# Patient Record
Sex: Male | Born: 1990 | Race: Black or African American | Hispanic: No | Marital: Single | Smoking: Current every day smoker
Health system: Southern US, Community
[De-identification: ages and names within clinical notes are randomized; demographics above are authoritative.]

## PROBLEM LIST (undated history)

## (undated) DIAGNOSIS — K219 Gastro-esophageal reflux disease without esophagitis: Secondary | ICD-10-CM

## (undated) DIAGNOSIS — F1910 Other psychoactive substance abuse, uncomplicated: Secondary | ICD-10-CM

## (undated) DIAGNOSIS — M51369 Other intervertebral disc degeneration, lumbar region without mention of lumbar back pain or lower extremity pain: Secondary | ICD-10-CM

---
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
## 2013-06-06 ENCOUNTER — Observation Stay (HOSPITAL_COMMUNITY): Payer: BC Managed Care – PPO | Admitting: Anesthesiology

## 2013-06-06 ENCOUNTER — Encounter (HOSPITAL_COMMUNITY)
Admission: EM | Disposition: A | Discharge status: Home / Self Care | Payer: Self-pay | Source: Home / Self Care | Attending: Emergency Medicine

## 2013-06-06 ENCOUNTER — Inpatient Hospital Stay: Admit: 2013-06-06 | Payer: Self-pay | Admitting: Ophthalmology

## 2013-06-06 ENCOUNTER — Observation Stay (HOSPITAL_COMMUNITY)
Admission: EM | Admit: 2013-06-06 | Discharge: 2013-06-07 | Disposition: A | Discharge status: Home / Self Care | Payer: BC Managed Care – PPO | Attending: General Surgery | Admitting: General Surgery

## 2013-06-06 ENCOUNTER — Encounter (HOSPITAL_COMMUNITY): Payer: Self-pay | Admitting: *Deleted

## 2013-06-06 ENCOUNTER — Emergency Department (HOSPITAL_COMMUNITY): Payer: BC Managed Care – PPO

## 2013-06-06 ENCOUNTER — Encounter (HOSPITAL_COMMUNITY): Payer: Self-pay | Admitting: Anesthesiology

## 2013-06-06 DIAGNOSIS — S0120XA Unspecified open wound of nose, initial encounter: Secondary | ICD-10-CM | POA: Insufficient documentation

## 2013-06-06 DIAGNOSIS — S01119A Laceration without foreign body of unspecified eyelid and periocular area, initial encounter: Secondary | ICD-10-CM | POA: Insufficient documentation

## 2013-06-06 DIAGNOSIS — S0180XA Unspecified open wound of other part of head, initial encounter: Secondary | ICD-10-CM | POA: Insufficient documentation

## 2013-06-06 DIAGNOSIS — Y9367 Activity, basketball: Secondary | ICD-10-CM | POA: Insufficient documentation

## 2013-06-06 DIAGNOSIS — S0531XA Ocular laceration without prolapse or loss of intraocular tissue, right eye, initial encounter: Secondary | ICD-10-CM

## 2013-06-06 DIAGNOSIS — S0181XA Laceration without foreign body of other part of head, initial encounter: Secondary | ICD-10-CM

## 2013-06-06 DIAGNOSIS — S51812A Laceration without foreign body of left forearm, initial encounter: Secondary | ICD-10-CM

## 2013-06-06 DIAGNOSIS — S51809A Unspecified open wound of unspecified forearm, initial encounter: Secondary | ICD-10-CM | POA: Insufficient documentation

## 2013-06-06 DIAGNOSIS — S0530XA Ocular laceration without prolapse or loss of intraocular tissue, unspecified eye, initial encounter: Principal | ICD-10-CM | POA: Insufficient documentation

## 2013-06-06 DIAGNOSIS — S0280XB Fracture of other specified skull and facial bones, unspecified side, initial encounter for open fracture: Secondary | ICD-10-CM | POA: Insufficient documentation

## 2013-06-06 DIAGNOSIS — Y9239 Other specified sports and athletic area as the place of occurrence of the external cause: Secondary | ICD-10-CM | POA: Insufficient documentation

## 2013-06-06 DIAGNOSIS — S058X9A Other injuries of unspecified eye and orbit, initial encounter: Secondary | ICD-10-CM

## 2013-06-06 HISTORY — PX: EYELID LACERATION REPAIR: SHX5333

## 2013-06-06 HISTORY — PX: PRIMARY CLOSURE: SHX6224

## 2013-06-06 HISTORY — PX: RUPTURED GLOBE EXPLORATION AND REPAIR: SHX2366

## 2013-06-06 LAB — TYPE AND SCREEN
ABO/RH(D): O POS
Antibody Screen: NEGATIVE
Unit division: 0
Unit division: 0

## 2013-06-06 LAB — CBC WITH DIFFERENTIAL/PLATELET
HCT: 41 % (ref 39.0–52.0)
Hemoglobin: 14 g/dL (ref 13.0–17.0)
Lymphocytes Relative: 51 % — ABNORMAL HIGH (ref 12–46)
Lymphs Abs: 3.3 10*3/uL (ref 0.7–4.0)
Monocytes Absolute: 0.5 10*3/uL (ref 0.1–1.0)
Monocytes Relative: 9 % (ref 3–12)
Neutro Abs: 2.4 10*3/uL (ref 1.7–7.7)
Neutrophils Relative %: 37 % — ABNORMAL LOW (ref 43–77)
RBC: 4.69 MIL/uL (ref 4.22–5.81)

## 2013-06-06 LAB — PROTIME-INR
INR: 0.95 (ref 0.00–1.49)
Prothrombin Time: 12.5 seconds (ref 11.6–15.2)

## 2013-06-06 LAB — COMPREHENSIVE METABOLIC PANEL
Albumin: 3.7 g/dL (ref 3.5–5.2)
Alkaline Phosphatase: 59 U/L (ref 39–117)
BUN: 22 mg/dL (ref 6–23)
CO2: 19 mEq/L (ref 19–32)
Chloride: 105 mEq/L (ref 96–112)
Creatinine, Ser: 1.56 mg/dL — ABNORMAL HIGH (ref 0.50–1.35)
GFR calc Af Amer: 73 mL/min — ABNORMAL LOW (ref >90)
GFR calc non Af Amer: 63 mL/min — ABNORMAL LOW (ref >90)
Glucose, Bld: 106 mg/dL — ABNORMAL HIGH (ref 70–99)
Potassium: 3.6 mEq/L (ref 3.5–5.1)
Total Bilirubin: 0.1 mg/dL — ABNORMAL LOW (ref 0.3–1.2)

## 2013-06-06 LAB — POCT I-STAT, CHEM 8
BUN: 25 mg/dL — ABNORMAL HIGH (ref 6–23)
Creatinine, Ser: 1.8 mg/dL — ABNORMAL HIGH (ref 0.50–1.35)
Glucose, Bld: 102 mg/dL — ABNORMAL HIGH (ref 70–99)
Sodium: 143 mEq/L (ref 135–145)
TCO2: 19 mmol/L (ref 0–100)

## 2013-06-06 SURGERY — REPAIR, LACERATION, EYELID
Anesthesia: General | Laterality: Right

## 2013-06-06 SURGERY — REPAIR, RUPTURE, GLOBE
Anesthesia: General | Site: Arm Lower | Laterality: Right

## 2013-06-06 MED ORDER — LIDOCAINE HCL (CARDIAC) 20 MG/ML IV SOLN
INTRAVENOUS | Status: DC | PRN
  Administered 2013-06-06: 100 mg via INTRAVENOUS

## 2013-06-06 MED ORDER — TETANUS-DIPHTH-ACELL PERTUSSIS 5-2.5-18.5 LF-MCG/0.5 IM SUSP: 0.5000 mL | Freq: Once | INTRAMUSCULAR | Status: AC

## 2013-06-06 MED ORDER — PROPOFOL 10 MG/ML IV BOLUS
INTRAVENOUS | Status: DC | PRN
  Administered 2013-06-06: 120 mg via INTRAVENOUS

## 2013-06-06 MED ORDER — IOHEXOL 300 MG/ML  SOLN
80.0000 mL | Freq: Once | INTRAMUSCULAR | Status: AC | PRN
  Administered 2013-06-06: 80 mL via INTRAVENOUS

## 2013-06-06 MED ORDER — BSS IO SOLN
INTRAOCULAR | Status: DC | PRN
  Administered 2013-06-06: 15 mL via INTRAOCULAR

## 2013-06-06 MED ORDER — FENTANYL CITRATE 0.05 MG/ML IJ SOLN
100.0000 ug | Freq: Once | INTRAMUSCULAR | Status: AC
  Administered 2013-06-06: 100 ug via INTRAVENOUS

## 2013-06-06 MED ORDER — CEFAZOLIN SODIUM-DEXTROSE 2-3 GM-% IV SOLR
INTRAVENOUS | Status: DC | PRN
  Administered 2013-06-06: 2 g via INTRAVENOUS

## 2013-06-06 MED ORDER — FENTANYL CITRATE 0.05 MG/ML IJ SOLN
INTRAMUSCULAR | Status: DC | PRN
  Administered 2013-06-06: 25 ug via INTRAVENOUS
  Administered 2013-06-06 (×2): 50 ug via INTRAVENOUS
  Administered 2013-06-06: 125 ug via INTRAVENOUS

## 2013-06-06 MED ORDER — FENTANYL CITRATE 0.05 MG/ML IJ SOLN
INTRAMUSCULAR | Status: AC
  Filled 2013-06-06: qty 2

## 2013-06-06 MED ORDER — FENTANYL CITRATE 0.05 MG/ML IJ SOLN
INTRAMUSCULAR | Status: AC | PRN
  Administered 2013-06-06: 100 ug via INTRAVENOUS

## 2013-06-06 MED ORDER — TETANUS-DIPHTH-ACELL PERTUSSIS 5-2.5-18.5 LF-MCG/0.5 IM SUSP
INTRAMUSCULAR | Status: AC
  Administered 2013-06-06: 0.5 mL via INTRAMUSCULAR
  Filled 2013-06-06: qty 0.5

## 2013-06-06 MED ORDER — 0.9 % SODIUM CHLORIDE (POUR BTL) OPTIME
TOPICAL | Status: DC | PRN
  Administered 2013-06-06: 1000 mL

## 2013-06-06 MED ORDER — LACTATED RINGERS IV SOLN
INTRAVENOUS | Status: DC | PRN
  Administered 2013-06-06 (×2): via INTRAVENOUS

## 2013-06-06 MED ORDER — LIDOCAINE HCL 4 % MT SOLN
OROMUCOSAL | Status: DC | PRN
  Administered 2013-06-06: 4 mL via TOPICAL

## 2013-06-06 MED ORDER — MIDAZOLAM HCL 5 MG/5ML IJ SOLN
INTRAMUSCULAR | Status: DC | PRN
  Administered 2013-06-06: 2 mg via INTRAVENOUS

## 2013-06-06 MED ORDER — ROCURONIUM BROMIDE 100 MG/10ML IV SOLN
INTRAVENOUS | Status: DC | PRN
  Administered 2013-06-06: 50 mg via INTRAVENOUS

## 2013-06-06 SURGICAL SUPPLY — 75 items
ACCESSORY FRAGMATOME (MISCELLANEOUS) IMPLANT
APL SRG 3 HI ABS STRL LF PLS (MISCELLANEOUS) ×2
APPLICATOR DR MATTHEWS STRL (MISCELLANEOUS) ×3 IMPLANT
BAG URINE DRAINAGE (UROLOGICAL SUPPLIES) IMPLANT
BLADE EYE CATARACT 19 1.4 BEAV (BLADE) IMPLANT
BLADE MVR KNIFE 19G (BLADE) IMPLANT
BLADE SURG 15 STRL LF DISP TIS (BLADE) IMPLANT
BLADE SURG 15 STRL SS (BLADE)
CANNULA ANT CHAM MAIN (OPHTHALMIC RELATED) IMPLANT
CANNULA SUBRETINAL FLUID 20G (BLADE) IMPLANT
CATH FOLEY 2WAY SLVR  5CC 16FR (CATHETERS)
CATH FOLEY 2WAY SLVR 5CC 16FR (CATHETERS) IMPLANT
CLOTH BEACON ORANGE TIMEOUT ST (SAFETY) ×3 IMPLANT
CORDS BIPOLAR (ELECTRODE) IMPLANT
COVER MAYO STAND STRL (DRAPES) IMPLANT
COVER SURGICAL LIGHT HANDLE (MISCELLANEOUS) IMPLANT
DRAPE OPHTHALMIC 77X100 STRL (CUSTOM PROCEDURE TRAY) ×3 IMPLANT
EAGLE VIT/RET MICRO PIC 168 25 (MISCELLANEOUS) IMPLANT
ERASER HMR WETFIELD 23G BP (MISCELLANEOUS) IMPLANT
FILTER BLUE MILLIPORE (MISCELLANEOUS) IMPLANT
GAS OPHTHALMIC (MISCELLANEOUS) IMPLANT
GOWN STRL NON-REIN LRG LVL3 (GOWN DISPOSABLE) ×6 IMPLANT
ILLUMINATOR CHOW PICK 25GA (MISCELLANEOUS) IMPLANT
KIT BASIN OR (CUSTOM PROCEDURE TRAY) ×3 IMPLANT
KIT PERFLUORON PROCEDURE 5ML (MISCELLANEOUS) IMPLANT
KIT ROOM TURNOVER OR (KITS) ×3 IMPLANT
KNIFE CRESCENT 1.75 EDGEAHEAD (BLADE) IMPLANT
KNIFE GRIESHABER SHARP 2.5MM (MISCELLANEOUS) IMPLANT
MARKER SKIN DUAL TIP RULER LAB (MISCELLANEOUS) IMPLANT
NDL 18GX1X1/2 (RX/OR ONLY) (NEEDLE) ×2 IMPLANT
NDL 25GX 5/8IN NON SAFETY (NEEDLE) ×2 IMPLANT
NDL HYPO 30X.5 LL (NEEDLE) IMPLANT
NEEDLE 18GX1X1/2 (RX/OR ONLY) (NEEDLE) ×3 IMPLANT
NEEDLE 25GX 5/8IN NON SAFETY (NEEDLE) ×3 IMPLANT
NEEDLE 27GAX1X1/2 (NEEDLE) IMPLANT
NEEDLE HYPO 30X.5 LL (NEEDLE) IMPLANT
NS IRRIG 1000ML POUR BTL (IV SOLUTION) ×3 IMPLANT
PACK VITRECTOMY CUSTOM (CUSTOM PROCEDURE TRAY) ×3 IMPLANT
PACK VITRECTOMY PIC MCHSVP (PACKS) IMPLANT
PAD ARMBOARD 7.5X6 YLW CONV (MISCELLANEOUS) ×6 IMPLANT
PROBE LASER 20G CVD ENDOCULAR (MISCELLANEOUS) IMPLANT
PROBE LASER LIGHT 20GA (MISCELLANEOUS) IMPLANT
REPL STRA BRUSH NDL (NEEDLE) IMPLANT
REPL STRA BRUSH NEEDLE (NEEDLE) IMPLANT
RESERVOIR BACK FLUSH (MISCELLANEOUS) IMPLANT
ROLLS DENTAL (MISCELLANEOUS) ×6 IMPLANT
SCRAPER DIAMOND DUST MEMBRANE (MISCELLANEOUS) IMPLANT
SET VGFI TUBING 8065808002 (SET/KITS/TRAYS/PACK) IMPLANT
SPEAR EYE SURG WECK-CEL (MISCELLANEOUS) ×3 IMPLANT
STOPCOCK 4 WAY LG BORE MALE ST (IV SETS) IMPLANT
SUT CHROMIC 5 0 P 3 (SUTURE) ×1 IMPLANT
SUT CHROMIC 7 0 TG140 8 (SUTURE) IMPLANT
SUT ETHILON 10 0 BV100 4 (SUTURE) IMPLANT
SUT ETHILON 10 0 BV75 4 (SUTURE) IMPLANT
SUT ETHILON 10 0 CS140 6 (SUTURE) IMPLANT
SUT ETHILON 7 0 P 1 (SUTURE) ×2 IMPLANT
SUT ETHILON 9 0 TG140 8 (SUTURE) IMPLANT
SUT MON AB 5-0 PS2 18 (SUTURE) ×1 IMPLANT
SUT SILK 2 0 (SUTURE)
SUT SILK 2-0 18XBRD TIE 12 (SUTURE) IMPLANT
SUT SILK 6 0 G 6 (SUTURE) ×1 IMPLANT
SUT VIC AB 5-0 P-3 18XBRD (SUTURE) IMPLANT
SUT VIC AB 5-0 P3 18 (SUTURE) ×3
SUT VICRYL 7 0 TG140 8 (SUTURE) IMPLANT
SWAB COLLECTION DEVICE MRSA (MISCELLANEOUS) IMPLANT
SYR 20CC LL (SYRINGE) ×3 IMPLANT
SYR 50ML SLIP (SYRINGE) IMPLANT
SYR 5ML LL (SYRINGE) IMPLANT
SYR TB 1ML LUER SLIP (SYRINGE) ×3 IMPLANT
SYRINGE 10CC LL (SYRINGE) IMPLANT
TOWEL OR 17X24 6PK STRL BLUE (TOWEL DISPOSABLE) ×9 IMPLANT
TUBE ANAEROBIC SPECIMEN COL (MISCELLANEOUS) IMPLANT
VITREORETINAL VISCODISSEC (MISCELLANEOUS) IMPLANT
WATER STERILE IRR 1000ML POUR (IV SOLUTION) ×3 IMPLANT
WIPE INSTRUMENT VISIWIPE 73X73 (MISCELLANEOUS) ×3 IMPLANT
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
## 2013-06-06 NOTE — Anesthesia Preprocedure Evaluation (Signed)
Anesthesia Evaluation  Patient identified by MRN, date of birth, ID band Patient awake    Reviewed: Allergy & Precautions, H&P , NPO status , Patient's Chart, lab work & pertinent test results  Airway Mallampati: I TM Distance: >3 FB Neck ROM: Full    Dental   Pulmonary          Cardiovascular     Neuro/Psych    GI/Hepatic   Endo/Other    Renal/GU      Musculoskeletal   Abdominal   Peds  Hematology   Anesthesia Other Findings   Reproductive/Obstetrics                           Anesthesia Physical Anesthesia Plan  ASA: II and emergent  Anesthesia Plan: General   Post-op Pain Management:    Induction: Intravenous, Rapid sequence and Cricoid pressure planned  Airway Management Planned: Oral ETT  Additional Equipment:   Intra-op Plan:   Post-operative Plan: Extubation in OR  Informed Consent: I have reviewed the patients History and Physical, chart, labs and discussed the procedure including the risks, benefits and alternatives for the proposed anesthesia with the patient or authorized representative who has indicated his/her understanding and acceptance.     Plan Discussed with: CRNA and Surgeon  Anesthesia Plan Comments:         Anesthesia Quick Evaluation  
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
## 2013-06-06 NOTE — ED Notes (Signed)
Pt transported to CT ?

## 2013-06-06 NOTE — ED Notes (Signed)
Patient last ate at 1600 today.
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
## 2013-06-06 NOTE — ED Notes (Signed)
Pt returned from CT/XRay. 
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
## 2013-06-06 NOTE — ED Notes (Signed)
Dr. Allyne Gee at bedside. Family updated on pt's condition. Informed consent signed

## 2013-06-06 NOTE — ED Notes (Signed)
Pt transported to Xray. 

## 2013-06-07 ENCOUNTER — Encounter (HOSPITAL_COMMUNITY): Payer: Self-pay | Admitting: Plastic Surgery

## 2013-06-07 DIAGNOSIS — S0181XA Laceration without foreign body of other part of head, initial encounter: Secondary | ICD-10-CM

## 2013-06-07 MED ORDER — BACITRACIN-POLYMYXIN B 500-10000 UNIT/GM OP OINT
TOPICAL_OINTMENT | OPHTHALMIC | Status: AC
  Filled 2013-06-07: qty 3.5

## 2013-06-07 MED ORDER — MORPHINE SULFATE 2 MG/ML IJ SOLN
2.0000 mg | INTRAMUSCULAR | Status: DC | PRN
  Administered 2013-06-07: 2 mg via INTRAVENOUS
  Administered 2013-06-07 (×2): 4 mg via INTRAVENOUS
  Filled 2013-06-07: qty 2
  Filled 2013-06-07: qty 1
  Filled 2013-06-07: qty 2

## 2013-06-07 MED ORDER — ONDANSETRON HCL 4 MG/2ML IJ SOLN: 4.0000 mg | Freq: Four times a day (QID) | INTRAMUSCULAR | Status: DC | PRN

## 2013-06-07 MED ORDER — HYDROMORPHONE HCL PF 1 MG/ML IJ SOLN
0.2500 mg | INTRAMUSCULAR | Status: DC | PRN
  Administered 2013-06-07 (×2): 0.5 mg via INTRAVENOUS

## 2013-06-07 MED ORDER — PANTOPRAZOLE SODIUM 40 MG PO TBEC: 40.0000 mg | DELAYED_RELEASE_TABLET | Freq: Every day | ORAL | Status: DC

## 2013-06-07 MED ORDER — PANTOPRAZOLE SODIUM 40 MG IV SOLR
40.0000 mg | Freq: Every day | INTRAVENOUS | Status: DC
  Administered 2013-06-07: 40 mg via INTRAVENOUS
  Filled 2013-06-07: qty 40

## 2013-06-07 MED ORDER — HYDROMORPHONE HCL PF 1 MG/ML IJ SOLN
INTRAMUSCULAR | Status: AC
  Filled 2013-06-07: qty 1

## 2013-06-07 MED ORDER — OXYCODONE HCL 5 MG/5ML PO SOLN: 5.0000 mg | Freq: Once | ORAL | Status: DC | PRN

## 2013-06-07 MED ORDER — ONDANSETRON HCL 4 MG/2ML IJ SOLN
INTRAMUSCULAR | Status: DC | PRN
  Administered 2013-06-07: 4 mg via INTRAVENOUS

## 2013-06-07 MED ORDER — ERYTHROMYCIN 5 MG/GM OP OINT
TOPICAL_OINTMENT | Freq: Four times a day (QID) | OPHTHALMIC | Status: DC
  Administered 2013-06-07 (×2): via OPHTHALMIC
  Filled 2013-06-07: qty 3.5

## 2013-06-07 MED ORDER — MEPERIDINE HCL 25 MG/ML IJ SOLN: 6.2500 mg | INTRAMUSCULAR | Status: DC | PRN

## 2013-06-07 MED ORDER — ONDANSETRON HCL 4 MG PO TABS: 4.0000 mg | ORAL_TABLET | Freq: Four times a day (QID) | ORAL | Status: DC | PRN

## 2013-06-07 MED ORDER — OXYCODONE HCL 5 MG PO TABS: 5.0000 mg | ORAL_TABLET | Freq: Once | ORAL | Status: DC | PRN

## 2013-06-07 MED ORDER — OXYCODONE HCL 5 MG PO TABS: 5.0000 mg | ORAL_TABLET | ORAL | Status: DC | PRN

## 2013-06-07 MED ORDER — ACETAMINOPHEN 325 MG PO TABS: 650.0000 mg | ORAL_TABLET | ORAL | Status: DC | PRN

## 2013-06-07 MED ORDER — BACITRACIN-POLYMYXIN B 500-10000 UNIT/GM OP OINT
TOPICAL_OINTMENT | OPHTHALMIC | Status: DC | PRN
  Administered 2013-06-07: 1 via OPHTHALMIC

## 2013-06-07 MED ORDER — ONDANSETRON HCL 4 MG/2ML IJ SOLN: 4.0000 mg | Freq: Once | INTRAMUSCULAR | Status: DC | PRN

## 2013-06-07 MED ORDER — OXYCODONE HCL 5 MG PO TABS: 10.0000 mg | ORAL_TABLET | ORAL | Status: DC | PRN

## 2013-06-07 MED ORDER — KCL IN DEXTROSE-NACL 20-5-0.45 MEQ/L-%-% IV SOLN
INTRAVENOUS | Status: DC
  Administered 2013-06-07: 02:00:00 via INTRAVENOUS
  Filled 2013-06-07 (×2): qty 1000

## 2013-06-07 MED ORDER — DEXAMETHASONE SODIUM PHOSPHATE 4 MG/ML IJ SOLN
INTRAMUSCULAR | Status: DC | PRN
  Administered 2013-06-07: 4 mg via INTRAVENOUS
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
## 2013-06-07 NOTE — Anesthesia Postprocedure Evaluation (Signed)
Anesthesia Post Note  Patient: Richard Perez  Procedure(s) Performed: Procedure(s) (LRB): REPAIR OF RUPTURED GLOBE (Right) EYELID LACERATION REPAIR (Right) PRIMARY CLOSURE  Anesthesia type: general  Patient location: PACU  Post pain: Pain level controlled  Post assessment: Patient's Cardiovascular Status Stable  Last Vitals:  Filed Vitals:   06/07/13 0120  BP: 148/77  Pulse: 81  Temp: 36.7 C  Resp: 25    Post vital signs: Reviewed and stable  Level of consciousness: sedated  Complications: No apparent anesthesia complications
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
## 2013-06-07 NOTE — Consult Note (Signed)
  Pt seen at bedside this am, feeling better. Facial dressing was clean dry and intact. Dressing was not removed. The condition of the eye was discussed as well as the poor prognosis. Pt is stable from an ophthalmic vantage for discharge. Pt will need follow up in one week as outpatient with [facility], with DrAllyne Gee.

## 2013-06-07 NOTE — Transfer of Care (Signed)
Immediate Anesthesia Transfer of Care Note  Patient: Richard Perez  Procedure(s) Performed: Procedure(s) with comments: REPAIR OF RUPTURED GLOBE (Right) EYELID LACERATION REPAIR (Right) PRIMARY CLOSURE - closure of left forearm laceration  Patient Location: PACU  Anesthesia Type:General  Level of Consciousness: sedated, patient cooperative and responds to stimulation  Airway & Oxygen Therapy: Patient Spontanous Breathing and Patient connected to nasal cannula oxygen  Post-op Assessment: Report given to PACU RN, Post -op Vital signs reviewed and stable and Patient moving all extremities X 4  Post vital signs: Reviewed and stable  Complications: No apparent anesthesia complications

## 2013-06-07 NOTE — Progress Notes (Signed)
UR completed 
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
## 2013-06-07 NOTE — Progress Notes (Signed)
Patient discharged to home with instructions given , family was at bedside, verbalized understanding.

## 2013-06-07 NOTE — Discharge Instructions (Signed)
Eye Patch There are many reasons for you to wear an eye patch, and different eye patches for each reason. PROTECTION If your eye has been injured or has undergone surgery:   Your eye may be vulnerable to infection or greater injury, until it heals.  After surgery, your doctor may want you to wear an eye patch, to prevent your eye from getting infected or wet, which increases the chance of infection.  After surgery, if your eye needed stitches (sutures) to close an incision, a patch may be needed to prevent infection. A patch also prevents the possibility that the sutures might come apart, from something touching or rubbing the eye. Do not drive or operate machinery while wearing a patch. Remember that having one eye covered eliminates your depth perception and your ability to judge distances. TYPES OF PATCHES Hard shell patches: Many eye specialists like to be extra careful, and will have you use a hard shell covering over a patch. Or they will have you use the hard shell by itself, over your eye, if they feel it is safe for your eye to be open. This type of patch adds extra protection from any blow to the eye area. Pressure patches: A pressure patch is used in specific situations. For example, it is used when the doctor wants the surface of the clear covering at the front of the eye (cornea), to heal rapidly. The patch stops any interference from the normal blinking of the eye. The pressure patch prevents blinking, allowing the cornea surface to heal.  A pressure patch is usually thick, and taped in a special way to your cheek and forehead, so that constant pressure is applied to your eye. It must be put on properly, to avoid too much pressure. Too much pressure can damage the eye. Too little pressure allows the eyelid to move under the patch. Cosmetic patches: People may use patches for cosmetic reasons, to hide an unsightly or absent eye. SEEK IMMEDIATE MEDICAL CARE IF:  You have increased  eye pain.  You develop a discharge from your eye, that is clear and watery or thick in consistency.  Your pressure patch loosens up, and needs to be replaced. Document Released: 08/13/2004 Document Revised: 02/13/2012 Document Reviewed: 10/09/2009 [facility] Patient Information 2014 [place], [place].  Assault, General Assault includes any behavior, whether intentional or reckless, which results in bodily injury to another person and/or damage to property. Included in this would be any behavior, intentional or reckless, that by its nature would be understood (interpreted) by a reasonable person as intent to harm another person or to damage his/her property. Threats may be oral or written. They may be communicated through regular mail, computer, fax, or phone. These threats may be direct or implied. FORMS OF ASSAULT INCLUDE:  Physically assaulting a person. This includes physical threats to inflict physical harm as well as:  Slapping.  Hitting.  Poking.  Kicking.  Punching.  Pushing.  Arson.  Sabotage.  Equipment vandalism.  Damaging or destroying property.  Throwing or hitting objects.  Displaying a weapon or an object that appears to be a weapon in a threatening manner.  Carrying a firearm of any kind.  Using a weapon to harm someone.  Using greater physical size/strength to intimidate another.  Making intimidating or threatening gestures.  Bullying.  Hazing.  Intimidating, threatening, hostile, or abusive language directed toward another person.  It communicates the intention to engage in violence against that person. And it leads a reasonable person to expect  that violent behavior may occur.  Stalking another person. IF IT HAPPENS AGAIN:  Immediately call for emergency help (911 in [place]  If someone poses clear and immediate danger to you, seek legal authorities to have a protective or restraining order put in place.  Less threatening assaults can at least  be reported to authorities. STEPS TO TAKE IF A SEXUAL ASSAULT HAS HAPPENED  Go to an area of safety. This may include a shelter or staying with a friend. Stay away from the area where you have been attacked. A large percentage of sexual assaults are caused by a friend, relative or associate.  If medications were given by your caregiver, take them as directed for the full length of time prescribed.  Only take over-the-counter or prescription medicines for pain, discomfort, or fever as directed by your caregiver.  If you have come in contact with a sexual disease, find out if you are to be tested again. If your caregiver is concerned about the HIV/AIDS virus, he/she may require you to have continued testing for several months.  For the protection of your privacy, test results can not be given over the phone. Make sure you receive the results of your test. If your test results are not back during your visit, make an appointment with your caregiver to find out the results. Do not assume everything is normal if you have not heard from your caregiver or the medical facility. It is important for you to follow up on all of your test results.  File appropriate papers with authorities. This is important in all assaults, even if it has occurred in a family or by a friend. SEEK MEDICAL CARE IF:  You have new problems because of your injuries.  You have problems that may be because of the medicine you are taking, such as:  Rash.  Itching.  Swelling.  Trouble breathing.  You develop belly (abdominal) pain, feel sick to your stomach (nausea) or are vomiting.  You begin to run a temperature.  You need supportive care or referral to a rape crisis center. These are centers with trained personnel who can help you get through this ordeal. SEEK IMMEDIATE MEDICAL CARE IF:  You are afraid of being threatened, beaten, or abused. In [place] call 911.  You receive new injuries related to abuse.  You  develop severe pain in any area injured in the assault or have any change in your condition that concerns you.  You faint or lose consciousness.  You develop chest pain or shortness of breath. Document Released: 11/21/2005 Document Revised: 02/13/2012 Document Reviewed: 07/09/2008 [facility] Patient Information 2014 [place], [place]. Erythromycin eye ointment to right face and laceration site once a day.   [facility] - DISCHARGE INSTRUCTIONS TO PATIENT  Activity:  Driving - Do not drive until cleared by opthalmology   Lifting - No limit  Wound Care:   Used Erythromycin ointment to eye 4 times per day.  May wash wound gently with soap and water starting tomorrow.  Use gauze over eye.  Diet:  As tolerated  Follow up appointment:  Call [facility] at [phone] for an appointment in one week.       Call Dr. Stephannie Li - [phone] - for follow up in one week.       Dr. Alan Ripper Sanger - [phone] - is the plastic surgeon.  Medications and dosages:  Resume your home medications.  You have a prescription for:  Vicodin  You have erythromycin opthalmic ointment.  Call [facility]  [phone]) if you have:  Temperature greater than 100.4,  Persistent nausea and vomiting,  Severe uncontrolled pain,  Redness, tenderness, or signs of infection (pain, swelling, redness, odor or green/yellow discharge around the site),  Difficulty breathing, headache or visual disturbances,  Any other questions or concerns you may have after discharge.  In an emergency, call 911 or go to an Emergency Department at a nearby hospital.

## 2013-06-08 ENCOUNTER — Emergency Department (HOSPITAL_COMMUNITY)
Admission: EM | Admit: 2013-06-08 | Discharge: 2013-06-08 | Disposition: A | Discharge status: Home / Self Care | Payer: BC Managed Care – PPO | Attending: Emergency Medicine | Admitting: Emergency Medicine

## 2013-06-08 ENCOUNTER — Encounter (HOSPITAL_COMMUNITY): Payer: Self-pay | Admitting: Emergency Medicine

## 2013-06-08 ENCOUNTER — Emergency Department (HOSPITAL_COMMUNITY): Payer: BC Managed Care – PPO

## 2013-06-08 DIAGNOSIS — S0993XA Unspecified injury of face, initial encounter: Secondary | ICD-10-CM | POA: Insufficient documentation

## 2013-06-08 DIAGNOSIS — F17200 Nicotine dependence, unspecified, uncomplicated: Secondary | ICD-10-CM | POA: Insufficient documentation

## 2013-06-08 DIAGNOSIS — S0990XD Unspecified injury of head, subsequent encounter: Secondary | ICD-10-CM

## 2013-06-08 DIAGNOSIS — S02402A Zygomatic fracture, unspecified, initial encounter for closed fracture: Secondary | ICD-10-CM

## 2013-06-08 DIAGNOSIS — S0531XD Ocular laceration without prolapse or loss of intraocular tissue, right eye, subsequent encounter: Secondary | ICD-10-CM

## 2013-06-08 DIAGNOSIS — S02401A Maxillary fracture, unspecified, initial encounter for closed fracture: Secondary | ICD-10-CM | POA: Insufficient documentation

## 2013-06-08 DIAGNOSIS — S0990XA Unspecified injury of head, initial encounter: Secondary | ICD-10-CM | POA: Insufficient documentation

## 2013-06-08 LAB — URINALYSIS, ROUTINE W REFLEX MICROSCOPIC
Bilirubin Urine: NEGATIVE
Glucose, UA: NEGATIVE mg/dL
Hgb urine dipstick: NEGATIVE
Ketones, ur: 15 mg/dL — AB
Leukocytes, UA: NEGATIVE
Nitrite: NEGATIVE
Protein, ur: NEGATIVE mg/dL
Specific Gravity, Urine: 1.025 (ref 1.005–1.030)
Urobilinogen, UA: 0.2 mg/dL (ref 0.0–1.0)
pH: 6.5 (ref 5.0–8.0)

## 2013-06-08 LAB — CBC
HCT: 37.4 % — ABNORMAL LOW (ref 39.0–52.0)
Hemoglobin: 12.7 g/dL — ABNORMAL LOW (ref 13.0–17.0)
MCH: 29.5 pg (ref 26.0–34.0)
MCHC: 34 g/dL (ref 30.0–36.0)
MCV: 87 fL (ref 78.0–100.0)
Platelets: 139 10*3/uL — ABNORMAL LOW (ref 150–400)
RBC: 4.3 MIL/uL (ref 4.22–5.81)
RDW: 13.6 % (ref 11.5–15.5)
WBC: 6.2 10*3/uL (ref 4.0–10.5)

## 2013-06-08 LAB — BASIC METABOLIC PANEL
BUN: 12 mg/dL (ref 6–23)
CO2: 26 mEq/L (ref 19–32)
Calcium: 8.3 mg/dL — ABNORMAL LOW (ref 8.4–10.5)
Chloride: 103 mEq/L (ref 96–112)
Creatinine, Ser: 1.16 mg/dL (ref 0.50–1.35)
GFR calc Af Amer: >90 mL/min (ref >90)
GFR calc non Af Amer: 90 mL/min — ABNORMAL LOW (ref >90)
Glucose, Bld: 96 mg/dL (ref 70–99)
Potassium: 3.7 mEq/L (ref 3.5–5.1)
Sodium: 140 mEq/L (ref 135–145)

## 2013-06-08 MED ORDER — MORPHINE SULFATE 4 MG/ML IJ SOLN
6.0000 mg | Freq: Once | INTRAMUSCULAR | Status: AC
  Administered 2013-06-08: 6 mg via INTRAVENOUS
  Filled 2013-06-08: qty 2

## 2013-06-08 MED ORDER — ONDANSETRON HCL 4 MG/2ML IJ SOLN
4.0000 mg | Freq: Once | INTRAMUSCULAR | Status: AC
  Administered 2013-06-08: 4 mg via INTRAVENOUS
  Filled 2013-06-08: qty 2

## 2013-06-08 MED ORDER — OXYCODONE-ACETAMINOPHEN 5-325 MG PO TABS: 1.0000 | ORAL_TABLET | Freq: Four times a day (QID) | ORAL | Status: DC | PRN

## 2013-06-08 MED ORDER — SODIUM CHLORIDE 0.9 % IV BOLUS (SEPSIS)
2000.0000 mL | Freq: Once | INTRAVENOUS | Status: AC
  Administered 2013-06-08: 2000 mL via INTRAVENOUS

## 2013-06-08 MED ORDER — CLINDAMYCIN HCL 300 MG PO CAPS: 300.0000 mg | ORAL_CAPSULE | Freq: Three times a day (TID) | ORAL | Status: DC
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
## 2013-06-08 NOTE — ED Provider Notes (Signed)
Medical screening examination/treatment/procedure(s) were performed by non-physician practitioner and as supervising physician I was immediately available for consultation/collaboration.   Richardean Canal, MD 06/08/13 [phone]
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
## 2013-06-10 ENCOUNTER — Encounter (HOSPITAL_COMMUNITY): Payer: Self-pay | Admitting: Ophthalmology
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
## 2013-06-13 ENCOUNTER — Telehealth (HOSPITAL_COMMUNITY): Payer: Self-pay | Admitting: Emergency Medicine

## 2013-06-13 NOTE — Telephone Encounter (Signed)
Left message

## 2013-06-13 NOTE — Telephone Encounter (Signed)
Made appt for 7/18 for staple removal.

## 2013-06-21 ENCOUNTER — Encounter (INDEPENDENT_AMBULATORY_CARE_PROVIDER_SITE_OTHER): Payer: Self-pay

## 2013-06-21 ENCOUNTER — Ambulatory Visit (INDEPENDENT_AMBULATORY_CARE_PROVIDER_SITE_OTHER): Payer: BC Managed Care – PPO | Admitting: Internal Medicine

## 2013-06-21 VITALS — BP 120/78 | HR 82 | Temp 97.8°F | Resp 14 | Ht 70.0 in | Wt 200.4 lb

## 2013-06-21 DIAGNOSIS — S41112A Laceration without foreign body of left upper arm, initial encounter: Secondary | ICD-10-CM | POA: Insufficient documentation

## 2013-06-21 DIAGNOSIS — S41112D Laceration without foreign body of left upper arm, subsequent encounter: Secondary | ICD-10-CM

## 2013-06-21 DIAGNOSIS — Z5189 Encounter for other specified aftercare: Secondary | ICD-10-CM
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
## 2013-06-21 NOTE — Patient Instructions (Signed)
Follow up as needed

## 2013-10-10 ENCOUNTER — Other Ambulatory Visit: Payer: Self-pay

## 2014-09-19 ENCOUNTER — Other Ambulatory Visit: Payer: Self-pay

## 2015-06-21 ENCOUNTER — Encounter (HOSPITAL_COMMUNITY): Payer: Self-pay | Admitting: Emergency Medicine

## 2015-06-21 ENCOUNTER — Emergency Department (HOSPITAL_COMMUNITY)
Admission: EM | Admit: 2015-06-21 | Discharge: 2015-06-21 | Disposition: A | Discharge status: Home / Self Care | Payer: 59 | Attending: Emergency Medicine | Admitting: Emergency Medicine

## 2015-06-21 DIAGNOSIS — K2920 Alcoholic gastritis without bleeding: Secondary | ICD-10-CM

## 2015-06-21 DIAGNOSIS — R05 Cough: Secondary | ICD-10-CM | POA: Insufficient documentation

## 2015-06-21 DIAGNOSIS — Z720 Tobacco use: Secondary | ICD-10-CM | POA: Insufficient documentation

## 2015-06-21 DIAGNOSIS — R1084 Generalized abdominal pain: Secondary | ICD-10-CM | POA: Diagnosis not present

## 2015-06-21 DIAGNOSIS — K2921 Alcoholic gastritis with bleeding: Secondary | ICD-10-CM | POA: Diagnosis not present

## 2015-06-21 DIAGNOSIS — K920 Hematemesis: Secondary | ICD-10-CM | POA: Diagnosis present

## 2015-06-21 DIAGNOSIS — F1010 Alcohol abuse, uncomplicated: Secondary | ICD-10-CM | POA: Insufficient documentation

## 2015-06-21 LAB — COMPREHENSIVE METABOLIC PANEL
ALK PHOS: 58 U/L (ref 38–126)
ALT: 32 U/L (ref 17–63)
ANION GAP: 8 (ref 5–15)
AST: 27 U/L (ref 15–41)
Albumin: 4 g/dL (ref 3.5–5.0)
BILIRUBIN TOTAL: 0.6 mg/dL (ref 0.3–1.2)
BUN: 21 mg/dL — AB (ref 6–20)
CHLORIDE: 103 mmol/L (ref 101–111)
CO2: 27 mmol/L (ref 22–32)
Calcium: 8.6 mg/dL — ABNORMAL LOW (ref 8.9–10.3)
Creatinine, Ser: 1.19 mg/dL (ref 0.61–1.24)
GFR calc non Af Amer: >60 mL/min (ref >60)
Glucose, Bld: 124 mg/dL — ABNORMAL HIGH (ref 65–99)
POTASSIUM: 3.9 mmol/L (ref 3.5–5.1)
Sodium: 138 mmol/L (ref 135–145)
Total Protein: 7.3 g/dL (ref 6.5–8.1)

## 2015-06-21 LAB — CBC WITH DIFFERENTIAL/PLATELET
BASOS ABS: 0 10*3/uL (ref 0.0–0.1)
Basophils Relative: 0 % (ref 0–1)
EOS PCT: 3 % (ref 0–5)
Eosinophils Absolute: 0.1 10*3/uL (ref 0.0–0.7)
HCT: 44 % (ref 39.0–52.0)
HEMOGLOBIN: 14.5 g/dL (ref 13.0–17.0)
LYMPHS ABS: 1.9 10*3/uL (ref 0.7–4.0)
Lymphocytes Relative: 34 % (ref 12–46)
MCH: 28.4 pg (ref 26.0–34.0)
MCHC: 33 g/dL (ref 30.0–36.0)
MCV: 86.1 fL (ref 78.0–100.0)
MONOS PCT: 9 % (ref 3–12)
Monocytes Absolute: 0.5 10*3/uL (ref 0.1–1.0)
NEUTROS PCT: 54 % (ref 43–77)
Neutro Abs: 3.1 10*3/uL (ref 1.7–7.7)
Platelets: 193 10*3/uL (ref 150–400)
RBC: 5.11 MIL/uL (ref 4.22–5.81)
RDW: 13.9 % (ref 11.5–15.5)
WBC: 5.6 10*3/uL (ref 4.0–10.5)

## 2015-06-21 LAB — LIPASE, BLOOD: LIPASE: 17 U/L — AB (ref 22–51)

## 2015-06-21 MED ORDER — OMEPRAZOLE 20 MG PO CPDR
20.0000 mg | DELAYED_RELEASE_CAPSULE | Freq: Every day | ORAL | Status: DC
Start: 2015-06-21 — End: 2016-06-15

## 2015-06-21 MED ORDER — FAMOTIDINE 20 MG PO TABS
20.0000 mg | ORAL_TABLET | Freq: Once | ORAL | Status: AC
  Administered 2015-06-21: 20 mg via ORAL
  Filled 2015-06-21: qty 1

## 2015-06-21 MED ORDER — ONDANSETRON 8 MG PO TBDP
8.0000 mg | ORAL_TABLET | Freq: Once | ORAL | Status: AC
  Administered 2015-06-21: 8 mg via ORAL
  Filled 2015-06-21: qty 1

## 2015-06-21 NOTE — Discharge Instructions (Signed)
Gastritis, Adult °Gastritis is soreness and puffiness (inflammation) of the lining of the stomach. If you do not get help, gastritis can cause bleeding and sores (ulcers) in the stomach. °HOME CARE  °· Only take medicine as told by your doctor. °· If you were given antibiotic medicines, take them as told. Finish the medicines even if you start to feel better. °· Drink enough fluids to keep your pee (urine) clear or pale yellow. °· Avoid foods and drinks that make your problems worse. Foods you may want to avoid include: °¨ Caffeine or alcohol. °¨ Chocolate. °¨ Mint. °¨ Garlic and onions. °¨ Spicy foods. °¨ Citrus fruits, including oranges, lemons, or limes. °¨ Food containing tomatoes, including sauce, chili, salsa, and pizza. °¨ Fried and fatty foods. °· Eat small meals throughout the day instead of large meals. °GET HELP RIGHT AWAY IF:  °· You have black or dark red poop (stools). °· You throw up (vomit) blood. It may look like coffee grounds. °· You cannot keep fluids down. °· Your belly (abdominal) pain gets worse. °· You have a fever. °· You do not feel better after 1 week. °· You have any other questions or concerns. °MAKE SURE YOU:  °· Understand these instructions. °· Will watch your condition. °· Will get help right away if you are not doing well or get worse. °Document Released: 05/09/2008 Document Revised: 02/13/2012 Document Reviewed: 01/04/2012 °ExitCare® Patient Information ©2015 [employer]. This information is not intended to replace advice given to you by your health care provider. Make sure you discuss any questions you have with your health care provider. ° °
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
## 2015-06-21 NOTE — ED Notes (Signed)
Pt states that he vomited blood once last night and his stomach hurts on the left side when he walks.  Denies nausea/vomiting today.

## 2015-06-21 NOTE — ED Provider Notes (Signed)
No further hematemesis. No black stool. Vital signs are stable. Discharge home.  Donnetta HutchingBrian Macaulay Reicher, MD 06/21/15 [phone]

## 2015-11-17 ENCOUNTER — Encounter (HOSPITAL_COMMUNITY): Payer: Self-pay | Admitting: *Deleted

## 2015-11-17 ENCOUNTER — Emergency Department (HOSPITAL_COMMUNITY)
Admission: EM | Admit: 2015-11-17 | Discharge: 2015-11-17 | Disposition: A | Discharge status: Home / Self Care | Payer: 59 | Attending: Emergency Medicine | Admitting: Emergency Medicine

## 2015-11-17 DIAGNOSIS — F17200 Nicotine dependence, unspecified, uncomplicated: Secondary | ICD-10-CM | POA: Insufficient documentation

## 2015-11-17 DIAGNOSIS — Z79899 Other long term (current) drug therapy: Secondary | ICD-10-CM | POA: Insufficient documentation

## 2015-11-17 DIAGNOSIS — N342 Other urethritis: Secondary | ICD-10-CM

## 2015-11-17 LAB — URINALYSIS, ROUTINE W REFLEX MICROSCOPIC
BILIRUBIN URINE: NEGATIVE
Glucose, UA: NEGATIVE mg/dL
KETONES UR: NEGATIVE mg/dL
NITRITE: NEGATIVE
Protein, ur: NEGATIVE mg/dL
pH: 6 (ref 5.0–8.0)

## 2015-11-17 LAB — URINE MICROSCOPIC-ADD ON

## 2015-11-17 MED ORDER — LIDOCAINE HCL (PF) 1 % IJ SOLN
INTRAMUSCULAR | Status: AC
  Filled 2015-11-17: qty 5

## 2015-11-17 MED ORDER — AZITHROMYCIN 250 MG PO TABS
1000.0000 mg | ORAL_TABLET | Freq: Once | ORAL | Status: AC
  Administered 2015-11-17: 1000 mg via ORAL
  Filled 2015-11-17: qty 4

## 2015-11-17 MED ORDER — CEFTRIAXONE SODIUM 250 MG IJ SOLR
250.0000 mg | Freq: Once | INTRAMUSCULAR | Status: AC
  Administered 2015-11-17: 250 mg via INTRAMUSCULAR
  Filled 2015-11-17: qty 250

## 2015-11-17 MED ORDER — ONDANSETRON HCL 4 MG PO TABS
4.0000 mg | ORAL_TABLET | Freq: Once | ORAL | Status: AC
  Administered 2015-11-17: 4 mg via ORAL
  Filled 2015-11-17: qty 1

## 2015-11-17 MED ORDER — IBUPROFEN 800 MG PO TABS
800.0000 mg | ORAL_TABLET | Freq: Once | ORAL | Status: AC
  Administered 2015-11-17: 800 mg via ORAL
  Filled 2015-11-17: qty 1
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
## 2015-11-17 NOTE — ED Notes (Signed)
Pt reporting burning with urination, frequency, and some lower abdominal pain.  Denies nausea or vomiting.

## 2015-11-17 NOTE — Discharge Instructions (Signed)
Your STD testing, and your blood work should be available in about 3 days. Someone from our flow manager's office will call you if any abnormal test. You were treated tonight with Rocephin and Zithromax. Please refrain from all sexual activity for the next 7 days. What she would've received the results of your tests, please notify any and all sexual partners. Urethritis, Adult Urethritis is an inflammation of the tube through which urine exits your bladder (urethra).  CAUSES Urethritis is often caused by an infection in your urethra. The infection can be viral, like herpes. The infection can also be bacterial, like gonorrhea. RISK FACTORS Risk factors of urethritis include:  Having sex without using a condom.  Having multiple sexual partners.  Having poor hygiene. SIGNS AND SYMPTOMS Symptoms of urethritis are less noticeable in women than in men. These symptoms include:  Burning feeling when you urinate (dysuria).  Discharge from your urethra.  Blood in your urine (hematuria).  Urinating more than usual. DIAGNOSIS  To confirm a diagnosis of urethritis, your health care provider will do the following:  Ask about your sexual history.  Perform a physical exam.  Have you provide a sample of your urine for lab testing.  Use a cotton swab to gently collect a sample from your urethra for lab testing. TREATMENT  It is important to treat urethritis. Depending on the cause, untreated urethritis may lead to serious genital infections and possibly infertility. Urethritis caused by a bacterial infection is treated with antibiotic medicine. All sexual partners must be treated.  HOME CARE INSTRUCTIONS  Do not have sex until the test results are known and treatment is completed, even if your symptoms go away before you finish treatment.  If you were prescribed an antibiotic, finish it all even if you start to feel better. SEEK MEDICAL CARE IF:   Your symptoms are not improved in 3  days.  Your symptoms are getting worse.  You develop abdominal pain or pelvic pain (in women).  You develop joint pain.  You have a fever. SEEK IMMEDIATE MEDICAL CARE IF:   You have severe pain in the belly, back, or side.  You have repeated vomiting. MAKE SURE YOU:  Understand these instructions.  Will watch your condition.  Will get help right away if you are not doing well or get worse.   This information is not intended to replace advice given to you by your health care provider. Make sure you discuss any questions you have with your health care provider.   Document Released: 05/17/2001 Document Revised: 04/07/2015 Document Reviewed: 07/22/2013 Elsevier Interactive Patient Education [employer]

## 2015-11-17 NOTE — ED Notes (Signed)
Pt alert & oriented x4, stable gait. Patient given discharge instructions, paperwork & prescription(s). Patient  instructed to stop at the registration desk to finish any additional paperwork. Patient verbalized understanding. Pt left department w/ no further questions. 

## 2015-11-17 NOTE — ED Notes (Signed)
Burning w/ urination that states on Sunday.

## 2015-11-19 LAB — HIV ANTIBODY (ROUTINE TESTING W REFLEX): HIV Screen 4th Generation wRfx: NONREACTIVE

## 2015-11-19 LAB — GC/CHLAMYDIA PROBE AMP (~~LOC~~) NOT AT ARMC
CHLAMYDIA, DNA PROBE: NEGATIVE
NEISSERIA GONORRHEA: POSITIVE — AB

## 2015-11-19 LAB — RPR: RPR Ser Ql: NONREACTIVE

## 2015-11-20 ENCOUNTER — Telehealth (HOSPITAL_COMMUNITY): Payer: Self-pay

## 2015-11-20 NOTE — Telephone Encounter (Signed)
[facility]  Treated with Zithromax and Rocephin.   [employer] form completed and faxed.    11/20/15 @ 11:33 LVM requesting callback.

## 2015-11-21 ENCOUNTER — Telehealth (HOSPITAL_COMMUNITY): Payer: Self-pay

## 2015-11-21 NOTE — Telephone Encounter (Signed)
Unable to reach by telephone. Letter sent to address on record.  

## 2015-12-08 ENCOUNTER — Telehealth (HOSPITAL_COMMUNITY): Payer: Self-pay

## 2015-12-08 ENCOUNTER — Telehealth (HOSPITAL_BASED_OUTPATIENT_CLINIC_OR_DEPARTMENT_OTHER): Payer: Self-pay | Admitting: Emergency Medicine

## 2015-12-08 NOTE — Telephone Encounter (Signed)
Lost to followup, letter returned, no forwarding address

## 2015-12-08 NOTE — Telephone Encounter (Signed)
Unable to reach by phone or mail.  Chart closed.   

## 2016-01-27 ENCOUNTER — Emergency Department (HOSPITAL_COMMUNITY)
Admission: EM | Admit: 2016-01-27 | Discharge: 2016-01-27 | Disposition: A | Discharge status: Home / Self Care | Payer: Self-pay | Attending: Emergency Medicine | Admitting: Emergency Medicine

## 2016-01-27 ENCOUNTER — Encounter (HOSPITAL_COMMUNITY): Payer: Self-pay

## 2016-01-27 DIAGNOSIS — R0602 Shortness of breath: Secondary | ICD-10-CM | POA: Insufficient documentation

## 2016-01-27 DIAGNOSIS — R079 Chest pain, unspecified: Secondary | ICD-10-CM | POA: Insufficient documentation

## 2016-01-27 DIAGNOSIS — F329 Major depressive disorder, single episode, unspecified: Secondary | ICD-10-CM | POA: Insufficient documentation

## 2016-01-27 DIAGNOSIS — F419 Anxiety disorder, unspecified: Secondary | ICD-10-CM | POA: Insufficient documentation

## 2016-01-27 DIAGNOSIS — M549 Dorsalgia, unspecified: Secondary | ICD-10-CM | POA: Insufficient documentation

## 2016-01-27 DIAGNOSIS — F17200 Nicotine dependence, unspecified, uncomplicated: Secondary | ICD-10-CM | POA: Insufficient documentation

## 2016-01-27 LAB — CBC
HEMATOCRIT: 44.7 % (ref 39.0–52.0)
HEMOGLOBIN: 14.4 g/dL (ref 13.0–17.0)
MCH: 27.6 pg (ref 26.0–34.0)
MCHC: 32.2 g/dL (ref 30.0–36.0)
MCV: 85.6 fL (ref 78.0–100.0)
Platelets: 231 10*3/uL (ref 150–400)
RBC: 5.22 MIL/uL (ref 4.22–5.81)
RDW: 13.6 % (ref 11.5–15.5)
WBC: 7.1 10*3/uL (ref 4.0–10.5)

## 2016-01-27 LAB — COMPREHENSIVE METABOLIC PANEL
ALBUMIN: 3.8 g/dL (ref 3.5–5.0)
ALK PHOS: 63 U/L (ref 38–126)
ALT: 34 U/L (ref 17–63)
AST: 32 U/L (ref 15–41)
Anion gap: 9 (ref 5–15)
BILIRUBIN TOTAL: 0.2 mg/dL — AB (ref 0.3–1.2)
BUN: 16 mg/dL (ref 6–20)
CO2: 26 mmol/L (ref 22–32)
CREATININE: 1.27 mg/dL — AB (ref 0.61–1.24)
Calcium: 9.5 mg/dL (ref 8.9–10.3)
Chloride: 105 mmol/L (ref 101–111)
GFR calc Af Amer: >60 mL/min (ref >60)
GLUCOSE: 143 mg/dL — AB (ref 65–99)
Potassium: 3.5 mmol/L (ref 3.5–5.1)
Sodium: 140 mmol/L (ref 135–145)
TOTAL PROTEIN: 7.1 g/dL (ref 6.5–8.1)

## 2016-01-27 LAB — ETHANOL: Alcohol, Ethyl (B): <5 mg/dL (ref <5)

## 2016-01-27 NOTE — ED Notes (Signed)
Called for pt. Again, there is no answer

## 2016-01-27 NOTE — ED Notes (Signed)
Called for pt. X2, He is not in the waiting area.

## 2016-01-27 NOTE — ED Notes (Signed)
Pt. Was sent here for medical evaluation for anxiety and depression.  Pt. Has had in the past suicidal thought,  He denies any thoughts at present time.   Pt. Reports having chest pain, back pain and sob. Does not know if he has ever had panic attacks. Skin is warm and dryl

## 2016-02-03 ENCOUNTER — Inpatient Hospital Stay (HOSPITAL_COMMUNITY)
Admission: AD | Admit: 2016-02-03 | Discharge: 2016-02-03 | DRG: 897 | Disposition: A | Discharge status: Home / Self Care | Payer: BLUE CROSS/BLUE SHIELD | Attending: Psychiatry | Admitting: Psychiatry

## 2016-02-03 ENCOUNTER — Encounter (HOSPITAL_COMMUNITY): Payer: Self-pay | Admitting: *Deleted

## 2016-02-03 DIAGNOSIS — G4700 Insomnia, unspecified: Secondary | ICD-10-CM | POA: Diagnosis present

## 2016-02-03 DIAGNOSIS — F419 Anxiety disorder, unspecified: Secondary | ICD-10-CM | POA: Diagnosis present

## 2016-02-03 DIAGNOSIS — F1414 Cocaine abuse with cocaine-induced mood disorder: Principal | ICD-10-CM | POA: Diagnosis present

## 2016-02-03 DIAGNOSIS — F329 Major depressive disorder, single episode, unspecified: Secondary | ICD-10-CM | POA: Diagnosis present

## 2016-02-03 DIAGNOSIS — R45851 Suicidal ideations: Secondary | ICD-10-CM | POA: Diagnosis present

## 2016-02-03 DIAGNOSIS — F32A Depression, unspecified: Secondary | ICD-10-CM | POA: Diagnosis present

## 2016-02-03 DIAGNOSIS — F1910 Other psychoactive substance abuse, uncomplicated: Secondary | ICD-10-CM | POA: Diagnosis present

## 2016-02-03 DIAGNOSIS — F1994 Other psychoactive substance use, unspecified with psychoactive substance-induced mood disorder: Secondary | ICD-10-CM | POA: Diagnosis present

## 2016-02-03 HISTORY — DX: Other psychoactive substance abuse, uncomplicated: F19.10

## 2016-02-03 MED ORDER — ALUM & MAG HYDROXIDE-SIMETH 200-200-20 MG/5ML PO SUSP: 30.0000 mL | ORAL | Status: DC | PRN

## 2016-02-03 MED ORDER — TRAZODONE HCL 50 MG PO TABS
50.0000 mg | ORAL_TABLET | Freq: Every evening | ORAL | Status: DC | PRN
  Filled 2016-02-03 (×2): qty 1

## 2016-02-03 MED ORDER — MAGNESIUM HYDROXIDE 400 MG/5ML PO SUSP: 30.0000 mL | Freq: Every day | ORAL | Status: DC | PRN

## 2016-02-03 MED ORDER — ACETAMINOPHEN 325 MG PO TABS: 650.0000 mg | ORAL_TABLET | Freq: Four times a day (QID) | ORAL | Status: DC | PRN
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
## 2016-06-15 ENCOUNTER — Encounter (HOSPITAL_COMMUNITY): Payer: Self-pay

## 2016-06-15 ENCOUNTER — Emergency Department (HOSPITAL_COMMUNITY)
Admission: EM | Admit: 2016-06-15 | Discharge: 2016-06-15 | Disposition: A | Discharge status: Home / Self Care | Payer: BLUE CROSS/BLUE SHIELD | Attending: Emergency Medicine | Admitting: Emergency Medicine

## 2016-06-15 DIAGNOSIS — N289 Disorder of kidney and ureter, unspecified: Secondary | ICD-10-CM

## 2016-06-15 DIAGNOSIS — F17210 Nicotine dependence, cigarettes, uncomplicated: Secondary | ICD-10-CM | POA: Insufficient documentation

## 2016-06-15 DIAGNOSIS — K920 Hematemesis: Secondary | ICD-10-CM | POA: Insufficient documentation

## 2016-06-15 HISTORY — DX: Gastro-esophageal reflux disease without esophagitis: K21.9

## 2016-06-15 LAB — TYPE AND SCREEN
ABO/RH(D): O POS
ANTIBODY SCREEN: NEGATIVE

## 2016-06-15 LAB — COMPREHENSIVE METABOLIC PANEL
ALBUMIN: 5.1 g/dL — AB (ref 3.5–5.0)
ALK PHOS: 70 U/L (ref 38–126)
ALT: 29 U/L (ref 17–63)
AST: 26 U/L (ref 15–41)
Anion gap: 10 (ref 5–15)
BILIRUBIN TOTAL: 0.7 mg/dL (ref 0.3–1.2)
BUN: 25 mg/dL — AB (ref 6–20)
CALCIUM: 10.1 mg/dL (ref 8.9–10.3)
CO2: 22 mmol/L (ref 22–32)
Chloride: 106 mmol/L (ref 101–111)
Creatinine, Ser: 2.08 mg/dL — ABNORMAL HIGH (ref 0.61–1.24)
GFR calc Af Amer: 50 mL/min — ABNORMAL LOW (ref >60)
GFR calc non Af Amer: 43 mL/min — ABNORMAL LOW (ref >60)
GLUCOSE: 134 mg/dL — AB (ref 65–99)
POTASSIUM: 3.9 mmol/L (ref 3.5–5.1)
SODIUM: 138 mmol/L (ref 135–145)
TOTAL PROTEIN: 8.5 g/dL — AB (ref 6.5–8.1)

## 2016-06-15 LAB — CBC WITH DIFFERENTIAL/PLATELET
BASOS ABS: 0 10*3/uL (ref 0.0–0.1)
BASOS PCT: 0 %
Eosinophils Absolute: 0.1 10*3/uL (ref 0.0–0.7)
Eosinophils Relative: 1 %
HEMATOCRIT: 46.7 % (ref 39.0–52.0)
HEMOGLOBIN: 15.8 g/dL (ref 13.0–17.0)
Lymphocytes Relative: 18 %
Lymphs Abs: 1.6 10*3/uL (ref 0.7–4.0)
MCH: 28.8 pg (ref 26.0–34.0)
MCHC: 33.8 g/dL (ref 30.0–36.0)
MCV: 85.1 fL (ref 78.0–100.0)
Monocytes Absolute: 0.9 10*3/uL (ref 0.1–1.0)
Monocytes Relative: 9 %
NEUTROS ABS: 6.6 10*3/uL (ref 1.7–7.7)
NEUTROS PCT: 72 %
Platelets: 224 10*3/uL (ref 150–400)
RBC: 5.49 MIL/uL (ref 4.22–5.81)
RDW: 13.6 % (ref 11.5–15.5)
WBC: 9.1 10*3/uL (ref 4.0–10.5)

## 2016-06-15 LAB — TROPONIN I

## 2016-06-15 MED ORDER — PANTOPRAZOLE SODIUM 40 MG IV SOLR
40.0000 mg | Freq: Once | INTRAVENOUS | Status: AC
  Administered 2016-06-15: 40 mg via INTRAVENOUS
  Filled 2016-06-15: qty 40

## 2016-06-15 MED ORDER — ONDANSETRON HCL 4 MG/2ML IJ SOLN
4.0000 mg | Freq: Once | INTRAMUSCULAR | Status: AC
  Administered 2016-06-15: 4 mg via INTRAVENOUS
  Filled 2016-06-15: qty 2

## 2016-06-15 MED ORDER — SODIUM CHLORIDE 0.9 % IV BOLUS (SEPSIS)
1000.0000 mL | Freq: Once | INTRAVENOUS | Status: AC
  Administered 2016-06-15: 1000 mL via INTRAVENOUS

## 2016-06-15 MED ORDER — ONDANSETRON HCL 8 MG PO TABS: 8.0000 mg | ORAL_TABLET | ORAL | Status: DC | PRN

## 2016-06-15 MED ORDER — PANTOPRAZOLE SODIUM 20 MG PO TBEC: 20.0000 mg | DELAYED_RELEASE_TABLET | Freq: Two times a day (BID) | ORAL | Status: DC

## 2016-06-15 NOTE — ED Provider Notes (Signed)
CSN: [id number]     Arrival date & time 06/15/16  1832 History   First MD Initiated Contact with Patient 06/15/16 1850     Chief Complaint  Patient presents with  . Hematemesis     (Consider location/radiation/quality/duration/timing/severity/associated sxs/prior Treatment) HPI.Marland Kitchen.Marland Kitchen.Marland Kitchen.Patient stated he vomited "a large amount of blood"2 today without any evidence of black stool. Patient smokes cigarettes, drinks alcohol, takes Aleve. No syncope, chest pain, dyspnea, diarrhea. No episodes of vomiting in the emergency department. Patient reports chronic intermittent chest pain.  Past Medical History  Diagnosis Date  . Substance abuse   . GERD (gastroesophageal reflux disease)    Past Surgical History  Procedure Laterality Date  . Ruptured globe exploration and repair Right 06/06/2013    Procedure: REPAIR OF RUPTURED GLOBE;  Surgeon: Donnel SaxonJason B Sanders, MD;  [facility];  Service: Ophthalmology;  Laterality: Right;  . Eyelid laceration repair Right 06/06/2013    Procedure: EYELID LACERATION REPAIR;  Surgeon: Donnel SaxonJason B Sanders, MD;  [facility];  Service: Ophthalmology;  Laterality: Right;  . Primary closure  06/06/2013    Procedure: PRIMARY CLOSURE;  Surgeon: Liz MaladyBurke E Thompson, MD;  [facility];  Service: General;;  closure of left forearm laceration   History reviewed. No pertinent family history. Social History  Substance Use Topics  . Smoking status: Current Every Day Smoker -- 1.00 packs/day    Types: Cigarettes  . Smokeless tobacco: Never Used  . Alcohol Use: Yes     Comment: Drinks heavily about 4 times weekly.      Review of Systems  All other systems reviewed and are negative.     Allergies  Review of patient's allergies indicates no known allergies.  Home Medications   Prior to Admission medications   Medication Sig Start Date End Date Taking? Authorizing Provider  ondansetron (ZOFRAN) 8 MG tablet Take 1 tablet (8 mg total) by mouth every 4 (four) hours as needed.  06/15/16   Donnetta HutchingBrian Erica Osuna, MD  pantoprazole (PROTONIX) 20 MG tablet Take 1 tablet (20 mg total) by mouth 2 (two) times daily. 06/15/16   Donnetta HutchingBrian Runell Kovich, MD   BP 122/83 mmHg  Pulse 74  Temp(Src) 98.4 F (36.9 C) (Oral)  Resp 15  Ht 5\' 10"  (1.778 m)  Wt 250 lb (113.399 kg)  BMI 35.87 kg/m2  SpO2 99% Physical Exam  Constitutional: He is oriented to person, place, and time.  Obese, no acute distress  HENT:  Head: Normocephalic and atraumatic.  Eyes: Conjunctivae are normal.  Neck: Neck supple.  Cardiovascular: Normal rate and regular rhythm.   Pulmonary/Chest: Effort normal and breath sounds normal.  Abdominal: Soft. Bowel sounds are normal.  Minimal epigastric tenderness  Musculoskeletal: Normal range of motion.  Neurological: He is alert and oriented to person, place, and time.  Skin: Skin is warm and dry.  Psychiatric: He has a normal mood and affect. His behavior is normal.  Nursing note and vitals reviewed.   ED Course  Procedures (including critical care time) Labs Review Labs Reviewed  COMPREHENSIVE METABOLIC PANEL - Abnormal; Notable for the following:    Glucose, Bld 134 (*)    BUN 25 (*)    Creatinine, Ser 2.08 (*)    Total Protein 8.5 (*)    Albumin 5.1 (*)    GFR calc non Af Amer 43 (*)    GFR calc Af Amer 50 (*)    All other components within normal limits  CBC WITH DIFFERENTIAL/PLATELET  TROPONIN I  TYPE AND SCREEN  Imaging Review No results found. I have personally reviewed and evaluated these images and lab results as part of my medical decision-making.   EKG Interpretation None      MDM   Final diagnoses:  Hematemesis, presence of nausea not specified    Patient is hemodynamically stable. He has not vomited in the ED. No black stool. He feels better after IV fluids, IV Protonix, IV Zofran. Discharge medications oral Protonix and Zofran 8 mg. Discussed elevated creatinine.    Donnetta Hutching, MD 06/15/16 2236

## 2016-06-15 NOTE — Discharge Instructions (Signed)
Hematemesis Hematemesis is when you vomit blood. It is a sign of bleeding in the upper part of your digestive tract. This is also called your gastrointestinal (GI) tract. Your upper GI tract includes your mouth, throat, esophagus, stomach, and the first part of your small intestine (duodenum).  Hematemesis is usually caused by bleeding from your esophagus or stomach. You may suddenly vomit bright red blood. You might also vomit old blood. It may look like coffee grounds. You may also have other symptoms, such as:  Stomach pain.  Heartburn.  Black and tarry stool.  HOME CARE INSTRUCTIONS  Watch your hematemesis for any changes. The following actions may help to lessen any discomfort you are feeling:  Take medicines only as directed by your health care provider. Do not take aspirin, ibuprofen, or any other anti-inflammatory medicine without approval from your health care provider.  Rest as needed.  Drink small sips of clear liquids often, as long as you can keep them down. Try to drink enough fluids to keep your urine clear or pale yellow.  Do not drink alcohol.  Do not use any tobacco products, including cigarettes, chewing tobacco, or electronic cigarettes. If you need help quitting, ask your health care provider.  Keep all follow-up visits as directed by your health care provider. This is important. SEEK MEDICAL CARE IF:   The vomiting of blood worsens, or begins again after it has stopped.  You have persistent stomach pain.  You have nausea, indigestion, or heartburn.  You feel weak or dizzy. SEEK IMMEDIATE MEDICAL CARE IF:   You faint or feel extremely weak.  You have a rapid heartbeat.  You are urinating less than normal or not at all.  You have persistent vomiting.  You vomit large amounts of bloody or dark material.  You vomit bright red blood.  You pass large, dark, or bloody stools.  You have chest pain or trouble breathing.   This information is not  intended to replace advice given to you by your health care provider. Make sure you discuss any questions you have with your health care provider.   Document Released: 12/29/2004 Document Revised: 12/12/2014 Document Reviewed: 07/16/2014 Elsevier Interactive Patient Education 2016 [employer]   Stop cigarettes, alcohol, ibuprofen, Aleve, aspirin, Goody's powders.  You have an ulcer in your stomach. Recommend seeing the gastroenterologist. Phone number given. Prescription for ulcer medicine and nausea medicine. Return if worse.   Additionally, your kidney function was abnormal today. Creatinine was 2.08. This will need to be rechecked by your primary care doctor.

## 2016-06-15 NOTE — ED Notes (Signed)
Patient states vomiting blood X2 today. Patient states pain to left neck and chest pain.

## 2016-12-27 ENCOUNTER — Encounter (HOSPITAL_COMMUNITY): Payer: Self-pay | Admitting: Emergency Medicine

## 2016-12-27 ENCOUNTER — Emergency Department (HOSPITAL_COMMUNITY)
Admission: EM | Admit: 2016-12-27 | Discharge: 2016-12-27 | Disposition: A | Discharge status: Home / Self Care | Payer: BLUE CROSS/BLUE SHIELD | Attending: Dermatology | Admitting: Dermatology

## 2016-12-27 DIAGNOSIS — F17210 Nicotine dependence, cigarettes, uncomplicated: Secondary | ICD-10-CM | POA: Insufficient documentation

## 2016-12-27 DIAGNOSIS — K0889 Other specified disorders of teeth and supporting structures: Secondary | ICD-10-CM | POA: Insufficient documentation

## 2016-12-27 DIAGNOSIS — Z5321 Procedure and treatment not carried out due to patient leaving prior to being seen by health care provider: Secondary | ICD-10-CM | POA: Insufficient documentation

## 2016-12-27 NOTE — ED Notes (Signed)
No answer x2 for a room 

## 2016-12-27 NOTE — ED Triage Notes (Signed)
Pt states he has had a toothache for a long time and needs the tooth pulled. Pt has had headache/left side of face pain off and on for 3 days. Neuro intact.

## 2016-12-27 NOTE — ED Notes (Signed)
Pt did not answer for room x1.  

## 2018-08-08 ENCOUNTER — Emergency Department (HOSPITAL_COMMUNITY)
Admission: EM | Admit: 2018-08-08 | Discharge: 2018-08-08 | Disposition: A | Discharge status: Home / Self Care | Payer: Self-pay | Attending: Emergency Medicine | Admitting: Emergency Medicine

## 2018-08-08 ENCOUNTER — Encounter (HOSPITAL_COMMUNITY): Payer: Self-pay | Admitting: Emergency Medicine

## 2018-08-08 ENCOUNTER — Other Ambulatory Visit: Payer: Self-pay

## 2018-08-08 DIAGNOSIS — J069 Acute upper respiratory infection, unspecified: Secondary | ICD-10-CM | POA: Insufficient documentation

## 2018-08-08 DIAGNOSIS — F17210 Nicotine dependence, cigarettes, uncomplicated: Secondary | ICD-10-CM | POA: Insufficient documentation

## 2018-08-08 DIAGNOSIS — Z79899 Other long term (current) drug therapy: Secondary | ICD-10-CM | POA: Insufficient documentation

## 2018-08-08 LAB — GROUP A STREP BY PCR: GROUP A STREP BY PCR: NOT DETECTED

## 2018-08-08 MED ORDER — ACETAMINOPHEN 500 MG PO TABS
1000.0000 mg | ORAL_TABLET | Freq: Once | ORAL | Status: AC
  Administered 2018-08-08: 1000 mg via ORAL
  Filled 2018-08-08: qty 2

## 2018-08-08 MED ORDER — FLUTICASONE PROPIONATE 50 MCG/ACT NA SUSP: 1.0000 | Freq: Every day | NASAL | 0 refills | Status: DC

## 2018-08-08 MED ORDER — PHENOL 1.4 % MT LIQD: 1.0000 | OROMUCOSAL | 0 refills | Status: DC | PRN

## 2018-08-08 MED ORDER — ONDANSETRON 4 MG PO TBDP: 4.0000 mg | ORAL_TABLET | Freq: Three times a day (TID) | ORAL | 0 refills | Status: DC | PRN

## 2018-08-08 MED ORDER — ONDANSETRON 4 MG PO TBDP
4.0000 mg | ORAL_TABLET | Freq: Once | ORAL | Status: AC
  Administered 2018-08-08: 4 mg via ORAL
  Filled 2018-08-08: qty 1

## 2018-08-08 NOTE — Discharge Instructions (Signed)
You likely have a viral illness.  This should be treated symptomatically. Use Tylenol or ibuprofen as needed for fevers or body aches. Use Flonase daily for nasal congestion and cough. Use sore throat spray as needed.  Use zofran as needed for nausea or vomiting.  Make sure you stay well-hydrated with water. Wash your hands frequently to prevent spread of infection. Follow-up in 1 week if your symptoms are not improving. Return to the emergency room if you develop chest pain, difficulty breathing, or any new or worsening symptoms.

## 2018-08-08 NOTE — ED Triage Notes (Signed)
Pt to ER for 2 days sore throat, body aches, and fever. Pt reports significant pain with swallowing.
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
## 2019-02-12 ENCOUNTER — Encounter (HOSPITAL_COMMUNITY): Payer: Self-pay

## 2019-02-12 ENCOUNTER — Other Ambulatory Visit: Payer: Self-pay

## 2019-02-12 ENCOUNTER — Emergency Department (HOSPITAL_COMMUNITY)
Admission: EM | Admit: 2019-02-12 | Discharge: 2019-02-12 | Disposition: A | Discharge status: Home / Self Care | Payer: Self-pay | Attending: Emergency Medicine | Admitting: Emergency Medicine

## 2019-02-12 DIAGNOSIS — F17210 Nicotine dependence, cigarettes, uncomplicated: Secondary | ICD-10-CM | POA: Insufficient documentation

## 2019-02-12 DIAGNOSIS — T7840XA Allergy, unspecified, initial encounter: Secondary | ICD-10-CM | POA: Insufficient documentation

## 2019-02-12 MED ORDER — FAMOTIDINE IN NACL 20-0.9 MG/50ML-% IV SOLN: 20.0000 mg | Freq: Once | INTRAVENOUS | Status: DC

## 2019-02-12 MED ORDER — SODIUM CHLORIDE 0.9 % IV BOLUS: 500.0000 mL | Freq: Once | INTRAVENOUS | Status: DC

## 2019-02-12 MED ORDER — EPINEPHRINE 0.3 MG/0.3ML IJ SOAJ: 0.3000 mg | INTRAMUSCULAR | 0 refills | Status: DC | PRN

## 2019-02-12 MED ORDER — FAMOTIDINE 20 MG PO TABS: 20.0000 mg | ORAL_TABLET | Freq: Two times a day (BID) | ORAL | 0 refills | Status: DC

## 2019-02-12 MED ORDER — PREDNISONE 10 MG PO TABS: 40.0000 mg | ORAL_TABLET | Freq: Every day | ORAL | 0 refills | Status: AC

## 2019-02-12 MED ORDER — DIPHENHYDRAMINE HCL 25 MG PO TABS: 25.0000 mg | ORAL_TABLET | Freq: Four times a day (QID) | ORAL | 0 refills | Status: DC

## 2019-02-12 MED ORDER — DIPHENHYDRAMINE HCL 50 MG/ML IJ SOLN: 50.0000 mg | Freq: Once | INTRAMUSCULAR | Status: DC

## 2019-02-12 MED ORDER — DIPHENHYDRAMINE HCL 25 MG PO CAPS
50.0000 mg | ORAL_CAPSULE | Freq: Once | ORAL | Status: AC
  Administered 2019-02-12: 50 mg via ORAL
  Filled 2019-02-12: qty 2

## 2019-02-12 MED ORDER — FAMOTIDINE 20 MG PO TABS
20.0000 mg | ORAL_TABLET | Freq: Once | ORAL | Status: AC
  Administered 2019-02-12: 20 mg via ORAL
  Filled 2019-02-12: qty 1

## 2019-02-12 MED ORDER — METHYLPREDNISOLONE SODIUM SUCC 125 MG IJ SOLR: 125.0000 mg | Freq: Once | INTRAMUSCULAR | Status: DC

## 2019-02-12 MED ORDER — PREDNISONE 20 MG PO TABS
60.0000 mg | ORAL_TABLET | Freq: Once | ORAL | Status: AC
  Administered 2019-02-12: 60 mg via ORAL
  Filled 2019-02-12: qty 3
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
## 2019-02-12 NOTE — ED Triage Notes (Signed)
Pt here for evaluation of facial swelling that started in his lower lip and spread to the right side of his face up to his eye. Pt denies any difficulty breathing or swallowing.Pt also c.o intermittent hives.Pt took benadryl with some relief but it comes right back. Pt denies any new soaps, lotions or detergents. Airway intact

## 2019-02-12 NOTE — Discharge Instructions (Signed)
You have been diagnosed today with Allergic Reaction.  At this time there does not appear to be the presence of an emergent medical condition, however there is always the potential for conditions to change. Please read and follow the below instructions.  Please return to the Emergency Department immediately for any new or worsening symptoms or if your symptoms return. Please be sure to follow up with your Primary Care Provider within one week regarding your visit today; please call their office to schedule an appointment even if you are feeling better for a follow-up visit. Please take the medications Benadryl, Pepcid, prednisone as prescribed to help with your symptoms.  Do not drive or operate machinery when taking Benadryl or Pepcid as these may make you sleepy. You may use the epinephrine pen as prescribed for severe allergic reactions.  If you use the epinephrine pen you must call 911 and be seen in the emergency department as your symptoms may quickly return after its use. Please follow-up with the allergist specialist on your discharge paperwork regarding your visit today for further evaluation of what may be causing her allergies. Get help right away if: You have symptoms of a very bad allergy reaction. These include: A swollen mouth, tongue, or throat. Pain or tightness in your chest. Trouble breathing. Being short of breath. Dizziness. Fainting. Very bad pain in your belly (abdomen). Throwing up (vomiting). Watery poop (diarrhea).  Please read the additional information packets attached to your discharge summary.  Do not take your medicine if  develop an itchy rash, swelling in your mouth or lips, or difficulty breathing.

## 2019-08-26 ENCOUNTER — Emergency Department (HOSPITAL_COMMUNITY): Payer: Self-pay

## 2019-08-26 ENCOUNTER — Emergency Department (HOSPITAL_COMMUNITY)
Admission: EM | Admit: 2019-08-26 | Discharge: 2019-08-26 | Disposition: A | Discharge status: Home / Self Care | Payer: Self-pay | Attending: Emergency Medicine | Admitting: Emergency Medicine

## 2019-08-26 ENCOUNTER — Encounter (HOSPITAL_COMMUNITY): Payer: Self-pay | Admitting: Emergency Medicine

## 2019-08-26 ENCOUNTER — Other Ambulatory Visit: Payer: Self-pay

## 2019-08-26 DIAGNOSIS — Z79899 Other long term (current) drug therapy: Secondary | ICD-10-CM | POA: Insufficient documentation

## 2019-08-26 DIAGNOSIS — R0789 Other chest pain: Secondary | ICD-10-CM | POA: Insufficient documentation

## 2019-08-26 DIAGNOSIS — F17210 Nicotine dependence, cigarettes, uncomplicated: Secondary | ICD-10-CM | POA: Insufficient documentation

## 2019-08-26 DIAGNOSIS — R0602 Shortness of breath: Secondary | ICD-10-CM | POA: Insufficient documentation

## 2019-08-26 DIAGNOSIS — R05 Cough: Secondary | ICD-10-CM | POA: Insufficient documentation

## 2019-08-26 DIAGNOSIS — Z20828 Contact with and (suspected) exposure to other viral communicable diseases: Secondary | ICD-10-CM | POA: Insufficient documentation

## 2019-08-26 DIAGNOSIS — R059 Cough, unspecified: Secondary | ICD-10-CM

## 2019-08-26 LAB — CBC
HCT: 43.1 % (ref 39.0–52.0)
Hemoglobin: 14.4 g/dL (ref 13.0–17.0)
MCH: 29.4 pg (ref 26.0–34.0)
MCHC: 33.4 g/dL (ref 30.0–36.0)
MCV: 88.1 fL (ref 80.0–100.0)
Platelets: 214 10*3/uL (ref 150–400)
RBC: 4.89 MIL/uL (ref 4.22–5.81)
RDW: 13.2 % (ref 11.5–15.5)
WBC: 4.5 10*3/uL (ref 4.0–10.5)
nRBC: 0 % (ref 0.0–0.2)

## 2019-08-26 LAB — BASIC METABOLIC PANEL
Anion gap: 7 (ref 5–15)
BUN: 11 mg/dL (ref 6–20)
CO2: 25 mmol/L (ref 22–32)
Calcium: 8.5 mg/dL — ABNORMAL LOW (ref 8.9–10.3)
Chloride: 107 mmol/L (ref 98–111)
Creatinine, Ser: 1.22 mg/dL (ref 0.61–1.24)
GFR calc Af Amer: >60 mL/min (ref >60)
GFR calc non Af Amer: >60 mL/min (ref >60)
Glucose, Bld: 106 mg/dL — ABNORMAL HIGH (ref 70–99)
Potassium: 3.7 mmol/L (ref 3.5–5.1)
Sodium: 139 mmol/L (ref 135–145)

## 2019-08-26 LAB — D-DIMER, QUANTITATIVE: D-Dimer, Quant: 0.45 ug/mL-FEU (ref 0.00–0.50)

## 2019-08-26 LAB — TROPONIN I (HIGH SENSITIVITY)
Troponin I (High Sensitivity): 17 ng/L (ref <18)
Troponin I (High Sensitivity): 14 ng/L (ref <18)

## 2019-08-26 MED ORDER — ALUM & MAG HYDROXIDE-SIMETH 200-200-20 MG/5ML PO SUSP
30.0000 mL | Freq: Once | ORAL | Status: AC
  Administered 2019-08-26: 30 mL via ORAL
  Filled 2019-08-26: qty 30

## 2019-08-26 MED ORDER — ALBUTEROL SULFATE HFA 108 (90 BASE) MCG/ACT IN AERS
2.0000 | INHALATION_SPRAY | Freq: Once | RESPIRATORY_TRACT | Status: AC
  Administered 2019-08-26: 10:00:00 2 via RESPIRATORY_TRACT
  Filled 2019-08-26: qty 6.7

## 2019-08-26 MED ORDER — ACETAMINOPHEN 500 MG PO TABS
1000.0000 mg | ORAL_TABLET | Freq: Once | ORAL | Status: AC
  Administered 2019-08-26: 1000 mg via ORAL
  Filled 2019-08-26: qty 2

## 2019-08-26 MED ORDER — LIDOCAINE VISCOUS HCL 2 % MT SOLN
15.0000 mL | Freq: Once | OROMUCOSAL | Status: AC
  Administered 2019-08-26: 15 mL via ORAL
  Filled 2019-08-26: qty 15

## 2019-08-26 MED ORDER — ALBUTEROL SULFATE HFA 108 (90 BASE) MCG/ACT IN AERS: 1.0000 | INHALATION_SPRAY | Freq: Four times a day (QID) | RESPIRATORY_TRACT | 0 refills | Status: DC | PRN

## 2019-08-26 MED ORDER — SODIUM CHLORIDE 0.9% FLUSH: 3.0000 mL | Freq: Once | INTRAVENOUS | Status: DC

## 2019-08-26 MED ORDER — PREDNISONE 20 MG PO TABS: 40.0000 mg | ORAL_TABLET | Freq: Every day | ORAL | 0 refills | Status: AC

## 2019-08-26 NOTE — Discharge Instructions (Addendum)
Your work-up today was reassuring, you do have a COVID-19 test pending, please continue to quarantine at home until you receive your results, likely in the next 2 to 3 days.  Take steroids for the next 5 days as directed and use inhaler to help with cough and shortness of breath.  I suspect your chest pain is likely muscle wall pain from repeated coughing.  You can use Motrin or Tylenol for this.  You can also take over-the-counter cough medications like Robitussin or Mucinex.  Please follow-up with your primary care doctor regarding the symptoms.  Return to the ED for any new or worsening symptoms.

## 2019-08-26 NOTE — ED Notes (Signed)
Patient verbalizes understanding of discharge instructions. Opportunity for questioning and answers were provided. Armband removed by staff, pt discharged from ED.  

## 2019-08-26 NOTE — ED Notes (Signed)
Patient transported to X-ray 
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
## 2019-08-26 NOTE — ED Triage Notes (Signed)
Pt reports L sided chest pain for the past few weeks, began having sob last night. Denies any other symptoms or recent sick contacts. resp e/u, nad.

## 2019-08-28 LAB — NOVEL CORONAVIRUS, NAA (HOSP ORDER, SEND-OUT TO REF LAB; TAT 18-24 HRS): SARS-CoV-2, NAA: NOT DETECTED

## 2019-10-14 ENCOUNTER — Emergency Department (HOSPITAL_COMMUNITY)
Admission: EM | Admit: 2019-10-14 | Discharge: 2019-10-14 | Disposition: A | Discharge status: Home / Self Care | Payer: Self-pay | Attending: Emergency Medicine | Admitting: Emergency Medicine

## 2019-10-14 DIAGNOSIS — R1084 Generalized abdominal pain: Secondary | ICD-10-CM | POA: Insufficient documentation

## 2019-10-14 LAB — COMPREHENSIVE METABOLIC PANEL
ALT: 23 U/L (ref 0–44)
AST: 22 U/L (ref 15–41)
Albumin: 3.7 g/dL (ref 3.5–5.0)
Alkaline Phosphatase: 45 U/L (ref 38–126)
Anion gap: 8 (ref 5–15)
BUN: 13 mg/dL (ref 6–20)
CO2: 28 mmol/L (ref 22–32)
Calcium: 9.1 mg/dL (ref 8.9–10.3)
Chloride: 105 mmol/L (ref 98–111)
Creatinine, Ser: 1.27 mg/dL — ABNORMAL HIGH (ref 0.61–1.24)
GFR calc Af Amer: >60 mL/min (ref >60)
GFR calc non Af Amer: >60 mL/min (ref >60)
Glucose, Bld: 101 mg/dL — ABNORMAL HIGH (ref 70–99)
Potassium: 4.2 mmol/L (ref 3.5–5.1)
Sodium: 141 mmol/L (ref 135–145)
Total Bilirubin: 0.3 mg/dL (ref 0.3–1.2)
Total Protein: 6.5 g/dL (ref 6.5–8.1)

## 2019-10-14 LAB — CBC
HCT: 46.3 % (ref 39.0–52.0)
Hemoglobin: 14.5 g/dL (ref 13.0–17.0)
MCH: 28.5 pg (ref 26.0–34.0)
MCHC: 31.3 g/dL (ref 30.0–36.0)
MCV: 91.1 fL (ref 80.0–100.0)
Platelets: 236 10*3/uL (ref 150–400)
RBC: 5.08 MIL/uL (ref 4.22–5.81)
RDW: 13.5 % (ref 11.5–15.5)
WBC: 5.5 10*3/uL (ref 4.0–10.5)
nRBC: 0 % (ref 0.0–0.2)

## 2019-10-14 LAB — URINALYSIS, ROUTINE W REFLEX MICROSCOPIC
Bilirubin Urine: NEGATIVE
Glucose, UA: NEGATIVE mg/dL
Hgb urine dipstick: NEGATIVE
Ketones, ur: NEGATIVE mg/dL
Leukocytes,Ua: NEGATIVE
Nitrite: NEGATIVE
Protein, ur: NEGATIVE mg/dL
Specific Gravity, Urine: 1.018 (ref 1.005–1.030)
pH: 5 (ref 5.0–8.0)

## 2019-10-14 LAB — LIPASE, BLOOD: Lipase: 30 U/L (ref 11–51)

## 2019-10-14 MED ORDER — FAMOTIDINE 20 MG PO TABS
20.0000 mg | ORAL_TABLET | Freq: Once | ORAL | Status: AC
  Administered 2019-10-14: 16:00:00 20 mg via ORAL
  Filled 2019-10-14: qty 1

## 2019-10-14 MED ORDER — DICYCLOMINE HCL 10 MG/ML IM SOLN
20.0000 mg | Freq: Once | INTRAMUSCULAR | Status: AC
  Administered 2019-10-14: 20 mg via INTRAMUSCULAR
  Filled 2019-10-14: qty 2

## 2019-10-14 MED ORDER — SODIUM CHLORIDE 0.9% FLUSH: 3.0000 mL | Freq: Once | INTRAVENOUS | Status: DC

## 2019-10-14 MED ORDER — ONDANSETRON 4 MG PO TBDP
4.0000 mg | ORAL_TABLET | Freq: Once | ORAL | Status: AC
  Administered 2019-10-14: 16:00:00 4 mg via ORAL
  Filled 2019-10-14: qty 1

## 2019-10-14 NOTE — ED Triage Notes (Signed)
Pt here with c/o lower abd pain , pt has history of same , he states that he is an every day drinker and heavy drinker on the weekends , pt has had some dark stools  Over the past few days
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
## 2019-10-14 NOTE — Discharge Instructions (Signed)
Return to the Emergency Department immediately if you experience any worsening abdominal pain, fever, persistent nausea and vomiting, inability keep any food down, pain with urination, blood in your urine or any other worsening or concerning symptoms. °

## 2019-10-26 ENCOUNTER — Other Ambulatory Visit: Payer: Self-pay

## 2019-10-26 ENCOUNTER — Emergency Department (HOSPITAL_COMMUNITY)
Admission: EM | Admit: 2019-10-26 | Discharge: 2019-10-26 | Discharge status: Against Medical Advice | Payer: Self-pay | Attending: Emergency Medicine | Admitting: Emergency Medicine

## 2019-10-26 ENCOUNTER — Encounter (HOSPITAL_COMMUNITY): Payer: Self-pay | Admitting: Emergency Medicine

## 2019-10-26 DIAGNOSIS — Z5321 Procedure and treatment not carried out due to patient leaving prior to being seen by health care provider: Secondary | ICD-10-CM | POA: Insufficient documentation

## 2019-10-26 NOTE — ED Notes (Signed)
Called for patient in waiting room and no answer.

## 2019-10-26 NOTE — ED Triage Notes (Signed)
Pt c/o pain and drainage to L eye x 5 days. Worse after vomiting (pt drinking etoh today) pt reports drainage tonight appeared to be bloody. Pt states he believes eye may have been scratched while playing with children.

## 2019-10-26 NOTE — ED Notes (Signed)
Pt calld to reassess vital signs no answer

## 2019-11-21 ENCOUNTER — Other Ambulatory Visit: Payer: Self-pay

## 2019-11-21 DIAGNOSIS — Z20822 Contact with and (suspected) exposure to covid-19: Secondary | ICD-10-CM

## 2019-11-22 ENCOUNTER — Telehealth: Payer: Self-pay

## 2019-11-22 NOTE — Telephone Encounter (Signed)
Patient called in requesting [place] lab results and MyChart password reset - DOB/Address verified - Results pending. Reviewed lab results process, reset MyChart password, no further questions.

## 2019-11-23 LAB — NOVEL CORONAVIRUS, NAA: SARS-CoV-2, NAA: NOT DETECTED

## 2021-03-09 ENCOUNTER — Emergency Department (HOSPITAL_COMMUNITY)
Admission: EM | Admit: 2021-03-09 | Discharge: 2021-03-09 | Discharge status: Against Medical Advice | Payer: Self-pay | Attending: Emergency Medicine | Admitting: Emergency Medicine

## 2021-03-09 ENCOUNTER — Other Ambulatory Visit: Payer: Self-pay

## 2021-03-09 ENCOUNTER — Encounter (HOSPITAL_COMMUNITY): Payer: Self-pay | Admitting: Emergency Medicine

## 2021-03-09 DIAGNOSIS — W540XXA Bitten by dog, initial encounter: Secondary | ICD-10-CM

## 2021-03-09 DIAGNOSIS — F17210 Nicotine dependence, cigarettes, uncomplicated: Secondary | ICD-10-CM | POA: Insufficient documentation

## 2021-03-09 DIAGNOSIS — Y9389 Activity, other specified: Secondary | ICD-10-CM | POA: Insufficient documentation

## 2021-03-09 DIAGNOSIS — S41151A Open bite of right upper arm, initial encounter: Secondary | ICD-10-CM | POA: Insufficient documentation

## 2021-03-09 DIAGNOSIS — Z23 Encounter for immunization: Secondary | ICD-10-CM | POA: Insufficient documentation

## 2021-03-09 DIAGNOSIS — S61551A Open bite of right wrist, initial encounter: Secondary | ICD-10-CM | POA: Insufficient documentation

## 2021-03-09 DIAGNOSIS — Y999 Unspecified external cause status: Secondary | ICD-10-CM | POA: Insufficient documentation

## 2021-03-09 DIAGNOSIS — S41131A Puncture wound without foreign body of right upper arm, initial encounter: Secondary | ICD-10-CM | POA: Insufficient documentation

## 2021-03-09 MED ORDER — OXYCODONE-ACETAMINOPHEN 5-325 MG PO TABS
1.0000 | ORAL_TABLET | Freq: Once | ORAL | Status: DC
  Filled 2021-03-09: qty 1

## 2021-03-09 MED ORDER — RABIES IMMUNE GLOBULIN 150 UNIT/ML IM INJ
20.0000 [IU]/kg | INJECTION | Freq: Once | INTRAMUSCULAR | Status: DC
  Filled 2021-03-09: qty 20

## 2021-03-09 MED ORDER — AMOXICILLIN-POT CLAVULANATE 875-125 MG PO TABS
1.0000 | ORAL_TABLET | Freq: Once | ORAL | Status: DC
  Filled 2021-03-09: qty 1

## 2021-03-09 MED ORDER — RABIES VACCINE, PCEC IM SUSR
1.0000 mL | Freq: Once | INTRAMUSCULAR | Status: DC
  Filled 2021-03-09: qty 1

## 2021-03-09 MED ORDER — TETANUS-DIPHTH-ACELL PERTUSSIS 5-2.5-18.5 LF-MCG/0.5 IM SUSY
0.5000 mL | PREFILLED_SYRINGE | Freq: Once | INTRAMUSCULAR | Status: DC
  Filled 2021-03-09: qty 0.5

## 2021-03-09 MED ORDER — RABIES IMMUNE GLOBULIN 150 UNIT/ML IM INJ
INJECTION | INTRAMUSCULAR | Status: AC
  Filled 2021-03-09: qty 18

## 2021-03-09 NOTE — ED Triage Notes (Signed)
Pt was bitten by a dog on right arm. Pt has wound to right lower arm and right upper arm.
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
## 2021-03-09 NOTE — ED Notes (Signed)
AC contacted for immune globulin.

## 2021-03-10 ENCOUNTER — Emergency Department (HOSPITAL_COMMUNITY): Payer: Self-pay

## 2021-03-10 ENCOUNTER — Emergency Department (HOSPITAL_COMMUNITY)
Admission: EM | Admit: 2021-03-10 | Discharge: 2021-03-10 | Disposition: A | Discharge status: Home / Self Care | Payer: Self-pay | Attending: Emergency Medicine | Admitting: Emergency Medicine

## 2021-03-10 DIAGNOSIS — T148XXA Other injury of unspecified body region, initial encounter: Secondary | ICD-10-CM

## 2021-03-10 MED ORDER — HYDROCODONE-ACETAMINOPHEN 5-325 MG PO TABS: 1.0000 | ORAL_TABLET | ORAL | 0 refills | Status: DC | PRN

## 2021-03-10 MED ORDER — LIDOCAINE HCL (PF) 1 % IJ SOLN
30.0000 mL | Freq: Once | INTRAMUSCULAR | Status: AC
  Administered 2021-03-10: 30 mL via INTRADERMAL
  Filled 2021-03-10: qty 30

## 2021-03-10 MED ORDER — AMOXICILLIN-POT CLAVULANATE 875-125 MG PO TABS
1.0000 | ORAL_TABLET | Freq: Once | ORAL | Status: AC
  Administered 2021-03-10: 1 via ORAL
  Filled 2021-03-10: qty 1

## 2021-03-10 MED ORDER — TETANUS-DIPHTH-ACELL PERTUSSIS 5-2.5-18.5 LF-MCG/0.5 IM SUSY
0.5000 mL | PREFILLED_SYRINGE | Freq: Once | INTRAMUSCULAR | Status: AC
  Administered 2021-03-10: 0.5 mL via INTRAMUSCULAR
  Filled 2021-03-10: qty 0.5

## 2021-03-10 MED ORDER — OXYCODONE-ACETAMINOPHEN 5-325 MG PO TABS
1.0000 | ORAL_TABLET | Freq: Once | ORAL | Status: AC
  Administered 2021-03-10: 1 via ORAL
  Filled 2021-03-10: qty 1

## 2021-03-10 MED ORDER — AMOXICILLIN-POT CLAVULANATE 875-125 MG PO TABS: 1.0000 | ORAL_TABLET | Freq: Two times a day (BID) | ORAL | 0 refills | Status: DC

## 2021-03-10 NOTE — ED Triage Notes (Signed)
Dog bite to right lower and right upper arm. Unknown tetanus.
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
## 2021-03-10 NOTE — ED Notes (Signed)
Topical antibiotic ointment, telfa, gauze and coban dressing applied to R forearm and upper arm.

## 2021-03-10 NOTE — ED Notes (Signed)
X-ray at bedside

## 2021-03-10 NOTE — Discharge Instructions (Signed)
It is important to take the antibiotic as prescribed to avoid infection. Take Norco for severe pain, and ibuprofen 600 mg (3 over-the-counter tablets) every 6 hours for pain and inflammation.   The sutures will need to be removed in 10 days. Go to your primary care provider, to any Urgent Care or return to the ED for this.

## 2021-03-10 NOTE — ED Notes (Signed)
According to hospital record, last tetanus 2014.

## 2021-09-22 ENCOUNTER — Encounter (HOSPITAL_COMMUNITY): Payer: Self-pay | Admitting: Emergency Medicine

## 2021-09-22 ENCOUNTER — Emergency Department (HOSPITAL_COMMUNITY): Payer: Self-pay

## 2021-09-22 ENCOUNTER — Emergency Department (HOSPITAL_COMMUNITY)
Admission: EM | Admit: 2021-09-22 | Discharge: 2021-09-22 | Disposition: A | Discharge status: Home / Self Care | Payer: Self-pay | Attending: Emergency Medicine | Admitting: Emergency Medicine

## 2021-09-22 DIAGNOSIS — W01198A Fall on same level from slipping, tripping and stumbling with subsequent striking against other object, initial encounter: Secondary | ICD-10-CM | POA: Insufficient documentation

## 2021-09-22 DIAGNOSIS — Y990 Civilian activity done for income or pay: Secondary | ICD-10-CM | POA: Insufficient documentation

## 2021-09-22 DIAGNOSIS — R0781 Pleurodynia: Secondary | ICD-10-CM | POA: Insufficient documentation

## 2021-09-22 DIAGNOSIS — F17210 Nicotine dependence, cigarettes, uncomplicated: Secondary | ICD-10-CM | POA: Insufficient documentation

## 2021-09-22 DIAGNOSIS — W19XXXA Unspecified fall, initial encounter: Secondary | ICD-10-CM

## 2021-09-22 DIAGNOSIS — Y9339 Activity, other involving climbing, rappelling and jumping off: Secondary | ICD-10-CM | POA: Insufficient documentation

## 2021-09-22 DIAGNOSIS — S43402A Unspecified sprain of left shoulder joint, initial encounter: Secondary | ICD-10-CM | POA: Insufficient documentation

## 2021-09-22 DIAGNOSIS — S4382XA Sprain of other specified parts of left shoulder girdle, initial encounter: Secondary | ICD-10-CM

## 2021-09-22 LAB — I-STAT CHEM 8, ED
BUN: 18 mg/dL (ref 6–20)
Calcium, Ion: 1.18 mmol/L (ref 1.15–1.40)
Chloride: 104 mmol/L (ref 98–111)
Creatinine, Ser: 1.2 mg/dL (ref 0.61–1.24)
Glucose, Bld: 92 mg/dL (ref 70–99)
HCT: 48 % (ref 39.0–52.0)
Hemoglobin: 16.3 g/dL (ref 13.0–17.0)
Potassium: 4.3 mmol/L (ref 3.5–5.1)
Sodium: 139 mmol/L (ref 135–145)
TCO2: 27 mmol/L (ref 22–32)

## 2021-09-22 MED ORDER — IBUPROFEN 600 MG PO TABS: 600.0000 mg | ORAL_TABLET | Freq: Four times a day (QID) | ORAL | 0 refills | Status: DC | PRN

## 2021-09-22 MED ORDER — MORPHINE SULFATE (PF) 4 MG/ML IV SOLN
4.0000 mg | Freq: Once | INTRAVENOUS | Status: AC
  Administered 2021-09-22: 4 mg via INTRAVENOUS
  Filled 2021-09-22: qty 1

## 2021-09-22 MED ORDER — IOHEXOL 300 MG/ML  SOLN
75.0000 mL | Freq: Once | INTRAMUSCULAR | Status: AC | PRN
  Administered 2021-09-22: 75 mL via INTRAVENOUS

## 2021-09-22 MED ORDER — CYCLOBENZAPRINE HCL 10 MG PO TABS: 10.0000 mg | ORAL_TABLET | Freq: Two times a day (BID) | ORAL | 0 refills | Status: DC | PRN
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
## 2021-09-22 NOTE — ED Triage Notes (Signed)
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 Patient complains of left shoulder pain, ROM limited by pain. PMS intact distal to injury, no obvious dislocation. Denies LOC.

## 2021-09-22 NOTE — ED Provider Notes (Signed)
Emergency Medicine Provider Triage Evaluation Note  Richard Perez , a 30 y.o. male  was evaluated in triage.  Pt complains of 7 foot fall from machinery.  Fell onto his left side.  Review of Systems  Positive: Left arm and rib pain Negative:   Physical Exam  BP (!) 145/92 (BP Location: Right Arm)   Pulse 72   Temp 97.7 F (36.5 C)   Resp 16   SpO2 96%  Gen:   Awake, no distress   Resp:  Normal effort  MSK:   Patient with full range of motion of left shoulder.  Tenderness to anterior left ribs as well as clavicle. Other:    Medical Decision Making  Medically screening exam initiated at 11:01 AM.  Appropriate orders placed.  Herson Prichard was informed that the remainder of the evaluation will be completed by another provider, this initial triage assessment does not replace that evaluation, and the importance of remaining in the ED until their evaluation is complete.     Saddie Benders, PA-C 09/22/21 1102    Gwyneth Sprout, MD 09/29/21 1545

## 2021-09-22 NOTE — Discharge Instructions (Signed)
You have been evaluated for your fall.  Fortunately no evidence of broken bone or internal injuries noted during this visit.  Take medications prescribed as needed for aches and pain.  Follow up with orthopedist as needed.  Return if you have any concerns.

## 2022-06-09 ENCOUNTER — Other Ambulatory Visit: Payer: Self-pay

## 2022-06-09 ENCOUNTER — Encounter (HOSPITAL_BASED_OUTPATIENT_CLINIC_OR_DEPARTMENT_OTHER): Payer: Self-pay

## 2022-06-09 ENCOUNTER — Emergency Department (HOSPITAL_BASED_OUTPATIENT_CLINIC_OR_DEPARTMENT_OTHER)
Admission: EM | Admit: 2022-06-09 | Discharge: 2022-06-09 | Disposition: A | Discharge status: Home / Self Care | Payer: Self-pay | Attending: Emergency Medicine | Admitting: Emergency Medicine

## 2022-06-09 ENCOUNTER — Emergency Department (HOSPITAL_BASED_OUTPATIENT_CLINIC_OR_DEPARTMENT_OTHER): Payer: Self-pay

## 2022-06-09 DIAGNOSIS — M5126 Other intervertebral disc displacement, lumbar region: Secondary | ICD-10-CM | POA: Insufficient documentation

## 2022-06-09 DIAGNOSIS — G8929 Other chronic pain: Secondary | ICD-10-CM | POA: Insufficient documentation

## 2022-06-09 DIAGNOSIS — M5127 Other intervertebral disc displacement, lumbosacral region: Secondary | ICD-10-CM

## 2022-06-09 DIAGNOSIS — R519 Headache, unspecified: Secondary | ICD-10-CM | POA: Insufficient documentation

## 2022-06-09 MED ORDER — CYCLOBENZAPRINE HCL 10 MG PO TABS: 10.0000 mg | ORAL_TABLET | Freq: Two times a day (BID) | ORAL | 0 refills | Status: AC | PRN

## 2022-06-09 MED ORDER — ACETAMINOPHEN 325 MG PO TABS: 650.0000 mg | ORAL_TABLET | Freq: Four times a day (QID) | ORAL | 0 refills | Status: DC | PRN

## 2022-06-09 MED ORDER — CYCLOBENZAPRINE HCL 10 MG PO TABS: 10.0000 mg | ORAL_TABLET | Freq: Two times a day (BID) | ORAL | 0 refills | Status: DC | PRN

## 2022-06-09 MED ORDER — IBUPROFEN 600 MG PO TABS: 600.0000 mg | ORAL_TABLET | Freq: Three times a day (TID) | ORAL | 0 refills | Status: DC | PRN

## 2022-06-09 MED ORDER — ACETAMINOPHEN 500 MG PO TABS
1000.0000 mg | ORAL_TABLET | Freq: Once | ORAL | Status: AC
  Administered 2022-06-09: 1000 mg via ORAL
  Filled 2022-06-09: qty 2

## 2022-06-09 MED ORDER — CYCLOBENZAPRINE HCL 10 MG PO TABS
10.0000 mg | ORAL_TABLET | Freq: Once | ORAL | Status: AC
  Administered 2022-06-09: 10 mg via ORAL
  Filled 2022-06-09: qty 1

## 2022-06-09 MED ORDER — IBUPROFEN 800 MG PO TABS
800.0000 mg | ORAL_TABLET | Freq: Once | ORAL | Status: AC
  Administered 2022-06-09: 800 mg via ORAL
  Filled 2022-06-09: qty 1

## 2022-06-09 NOTE — ED Triage Notes (Signed)
Patient here POV from Home.  Endorses sustaining a Fall approximately 4 Months ago and since then the Patient has been having Intermittent Back Pain that is usually worse in the AM and is Lower Back.   Also endorses having Intermittent Headaches since this occurred.   NAD Noted during Triage. A&Ox4. GCS 15. Ambulatory.

## 2022-06-09 NOTE — ED Provider Notes (Signed)
[facility] EMERGENCY DEPT Provider Note   CSN: [id number] Arrival date & time: 06/09/22  1839     History  Chief Complaint  Patient presents with   Back Pain   Headache    Richard Perez is a 31 y.o. male presenting emerged department complaining of lower back pain.  This is ongoing for approximately 6 or 7 months per his report, since he had a traumatic fall late last year, and fell from a height directly onto his back.  He was seen in the ER in October for this fall, and had CT imaging of the thoracic spine and chest, which did not show any traumatic injury, but did not have imaging of the lumbar spine.  He reports he has had daily pain in his lower back since then.  It is worse with movement.  He does work in [employer] outdoors and is often using power tools.  He reports that the pain became more intense the past few days, radiates in a band across his lower back, does not go down his legs.  He also reports he had a bitemporal headache, but wonders whether this is due to being outside working in the high heat.  He does drink a lot of water regularly.  HPI     Home Medications Prior to Admission medications   Medication Sig Start Date End Date Taking? Authorizing Provider  acetaminophen (TYLENOL) 325 MG tablet Take 2 tablets (650 mg total) by mouth every 6 (six) hours as needed for up to 30 doses for moderate pain or mild pain. 06/09/22   Terald Sleeper, MD  albuterol (VENTOLIN HFA) 108 (90 Base) MCG/ACT inhaler Inhale 1-2 puffs into the lungs every 6 (six) hours as needed for wheezing or shortness of breath. 08/26/19   Dartha Lodge, PA-C  amoxicillin-clavulanate (AUGMENTIN) 875-125 MG tablet Take 1 tablet by mouth every 12 (twelve) hours. 03/10/21   Elpidio Anis, PA-C  cyclobenzaprine (FLEXERIL) 10 MG tablet Take 1 tablet (10 mg total) by mouth 2 (two) times daily as needed for muscle spasms. 09/22/21   Fayrene Helper, PA-C  cyclobenzaprine (FLEXERIL) 10 MG tablet  Take 1 tablet (10 mg total) by mouth 2 (two) times daily as needed for up to 20 days for muscle spasms. 06/09/22 06/29/22  Terald Sleeper, MD  HYDROcodone-acetaminophen (NORCO/VICODIN) 5-325 MG tablet Take 1 tablet by mouth every 4 (four) hours as needed. 03/10/21   Elpidio Anis, PA-C  ibuprofen (ADVIL) 600 MG tablet Take 1 tablet (600 mg total) by mouth every 6 (six) hours as needed for moderate pain. 09/22/21   Fayrene Helper, PA-C  ibuprofen (ADVIL) 600 MG tablet Take 1 tablet (600 mg total) by mouth every 8 (eight) hours as needed for up to 30 doses for mild pain or moderate pain. 06/09/22   Terald Sleeper, MD      Allergies    Mold extract [trichophyton] and Other    Review of Systems   Review of Systems  Physical Exam Updated Vital Signs BP 126/74   Pulse 76   Temp (!) 96.9 F (36.1 C) (Temporal)   Resp 16   Ht 5\' 10"  (1.778 m)   Wt 124 kg   SpO2 96%   BMI 39.22 kg/m  Physical Exam Constitutional:      General: He is not in acute distress.    Appearance: He is obese.  HENT:     Head: Normocephalic and atraumatic.  Eyes:     Conjunctiva/sclera: Conjunctivae normal.  Pupils: Pupils are equal, round, and reactive to light.  Cardiovascular:     Rate and Rhythm: Normal rate and regular rhythm.  Pulmonary:     Effort: Pulmonary effort is normal. No respiratory distress.  Abdominal:     General: There is no distension.     Tenderness: There is no abdominal tenderness.  Skin:    General: Skin is warm and dry.  Neurological:     General: No focal deficit present.     Mental Status: He is alert. Mental status is at baseline.     GCS: GCS eye subscore is 4. GCS verbal subscore is 5. GCS motor subscore is 6.     Sensory: No sensory deficit.     Comments: Midline lumbar tenderness, paraspinal tenderness  Psychiatric:        Mood and Affect: Mood normal.        Behavior: Behavior normal.     ED Results / Procedures / Treatments   Labs (all labs ordered are listed, but  only abnormal results are displayed) Labs Reviewed - No data to display  EKG None  Radiology CT Lumbar Spine Wo Contrast  Result Date: 06/09/2022 CLINICAL DATA:  Back trauma. Persistent midline lumbar pain after a fall 6 months ago. EXAM: CT LUMBAR SPINE WITHOUT CONTRAST TECHNIQUE: Multidetector CT imaging of the lumbar spine was performed without intravenous contrast administration. Multiplanar CT image reconstructions were also generated. RADIATION DOSE REDUCTION: This exam was performed according to the departmental dose-optimization program which includes automated exposure control, adjustment of the mA and/or kV according to patient size and/or use of iterative reconstruction technique. COMPARISON:  None Available. FINDINGS: Segmentation: 5 lumbar type vertebrae. Alignment: Minimal stepwise retrolisthesis from L1-2 through L5-S1. Vertebrae: No fracture or suspicious osseous lesion. Unfused apophysis of the left L1 transverse process. Paraspinal and other soft tissues: Unremarkable. Disc levels: Preserved disc space heights. Moderate-sized right paracentral disc protrusion at L5-S1 resulting in spinal stenosis and potential right-sided nerve root impingement, particularly of S1. IMPRESSION: 1. No lumbar spine fracture. 2. Right paracentral disc protrusion at S1 resulting in spinal stenosis and potential right-sided nerve root impingement. Electronically Signed   By: Sebastian Ache M.D.   On: 06/09/2022 19:45    Procedures Procedures    Medications Ordered in ED Medications  ibuprofen (ADVIL) tablet 800 mg (800 mg Oral Given 06/09/22 1942)  acetaminophen (TYLENOL) tablet 1,000 mg (1,000 mg Oral Given 06/09/22 1942)  cyclobenzaprine (FLEXERIL) tablet 10 mg (10 mg Oral Given 06/09/22 1946)    ED Course/ Medical Decision Making/ A&P                           Medical Decision Making Amount and/or Complexity of Data Reviewed Radiology: ordered.  Risk OTC drugs. Prescription drug  management.   This patient presents to the ED with concern for low back pain. This involves an extensive number of treatment options, and is a complaint that carries with it a high risk of complications and morbidity.  The differential diagnosis includes traumatic injury including lumbar fracture versus disc herniation versus paraspinal spasm versus dehydration  Headache does appear tension type and may be exacerbated by dehydration.  No red flags for intracranial hemorrhage, subarachnoid bleed, or meningitis at this time.  I do not believe he needs emergent CT imaging of the brain or LP at this time.  He has no red flags for cauda equina syndrome.  Not believe need emergent MRI imaging of  the lumbar spine.  I ordered imaging studies including CT of the lumbar spine I independently visualized and interpreted imaging which showed no acute fracture but concern for S1  central disc herniation I agree with the radiologist interpretation  I ordered medication including ibuprofen and Tylenol for pain.  We could also try muscle relaxers at home.  The patient has been using natural and home remedies, but not taking any over-the-counter medications otherwise.  After the interventions noted above, I reevaluated the patient and found that they have: improved  Dispostion:  After consideration of the diagnostic results and the patients response to treatment, I feel that the patent would benefit from outpatient PCP or spine clinic follow-up.  I suspect his pain is likely related to this S1 disc herniation.  No red flags for cauda equina syndrome.        Final Clinical Impression(s) / ED Diagnoses Final diagnoses:  Lumbosacral disc herniation  Chronic midline low back pain without sciatica    Rx / DC Orders ED Discharge Orders          Ordered    cyclobenzaprine (FLEXERIL) 10 MG tablet  2 times daily PRN,   Status:  Discontinued        06/09/22 1957    ibuprofen (ADVIL) 600 MG tablet  Every  8 hours PRN,   Status:  Discontinued        06/09/22 1957    acetaminophen (TYLENOL) 325 MG tablet  Every 6 hours PRN,   Status:  Discontinued        06/09/22 1957    acetaminophen (TYLENOL) 325 MG tablet  Every 6 hours PRN,   Status:  Discontinued        06/09/22 2000    cyclobenzaprine (FLEXERIL) 10 MG tablet  2 times daily PRN,   Status:  Discontinued        06/09/22 2000    ibuprofen (ADVIL) 600 MG tablet  Every 8 hours PRN,   Status:  Discontinued        06/09/22 2000    acetaminophen (TYLENOL) 325 MG tablet  Every 6 hours PRN        06/09/22 2002    cyclobenzaprine (FLEXERIL) 10 MG tablet  2 times daily PRN        06/09/22 2002    ibuprofen (ADVIL) 600 MG tablet  Every 8 hours PRN        06/09/22 2002              Terald Sleeper, MD 06/09/22 2023

## 2022-06-09 NOTE — Discharge Instructions (Signed)
Your CT scan did not show any broken bones or fractures in your spine.  However we do see signs of a bulging disc just below your lumbar spine, at S1, the sacral spine.  This may need follow-up care or imaging.  MRI imaging is a better way to look at your lower back.  MRI can be arranged by your primary care clinic, or else he can see a spine specialist at the [facility] neurosurgery office.  Please call tomorrow to schedule follow-up appointment in the office.  In the meantime, I prescribed a muscle relaxer called Flexeril, which can help with back spasms. I also prescribed ibuprofen for pain and swelling, which you can take up to 3 times per day for pain.  Try to take ibuprofen with food whenever possible.  You should not be using ibuprofen every single day, as long-term use can cause stomach ulcers and kidney problems.  If you need additional pain medicine you can also take Tylenol, which is safe for longer term use.

## 2022-07-29 IMAGING — DX DG RIBS W/ CHEST 3+V*L*
4 series · 4 of 4 positions shown · non-contrast
Comparison: 08/26/2019

CLINICAL DATA: Pain post fall

EXAM:
LEFT RIBS AND CHEST - 3+ VIEW

[w ribs ap upper left]
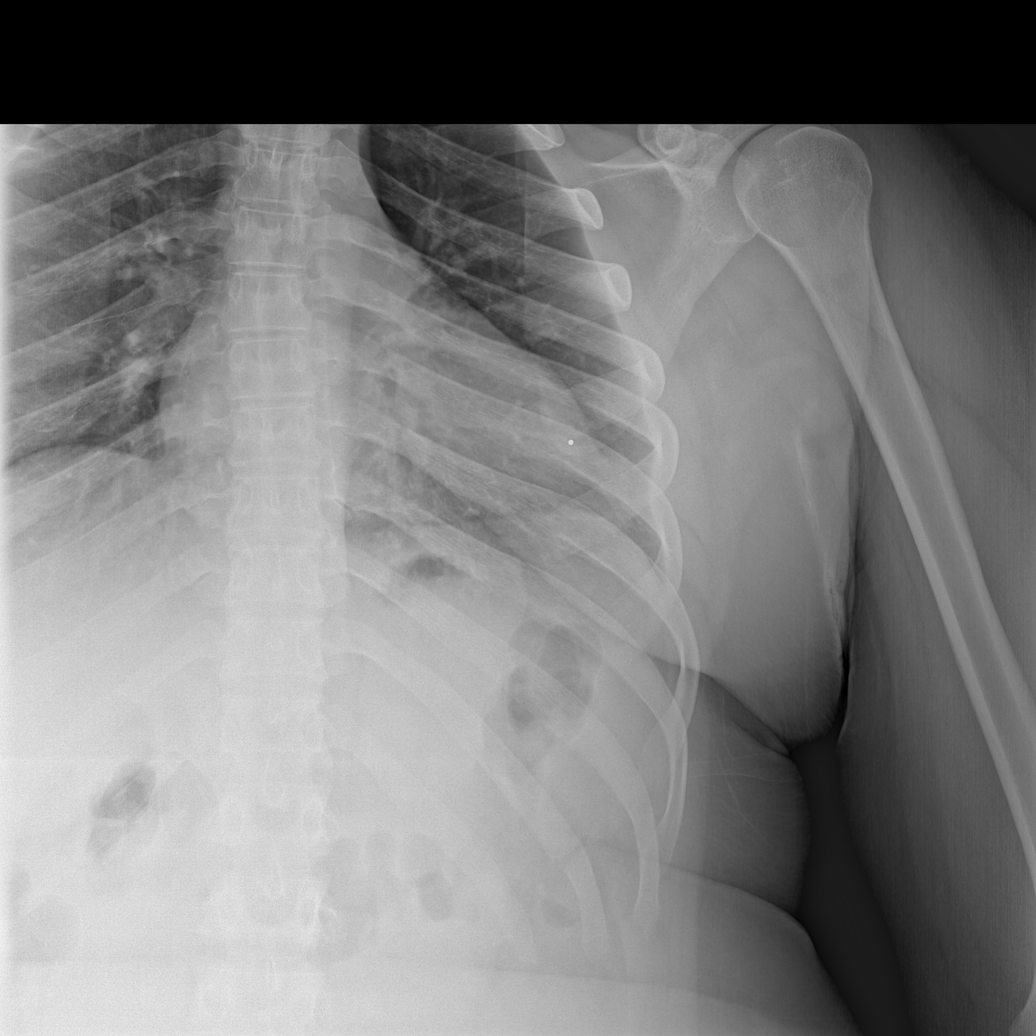

[w ribs ap lower left]
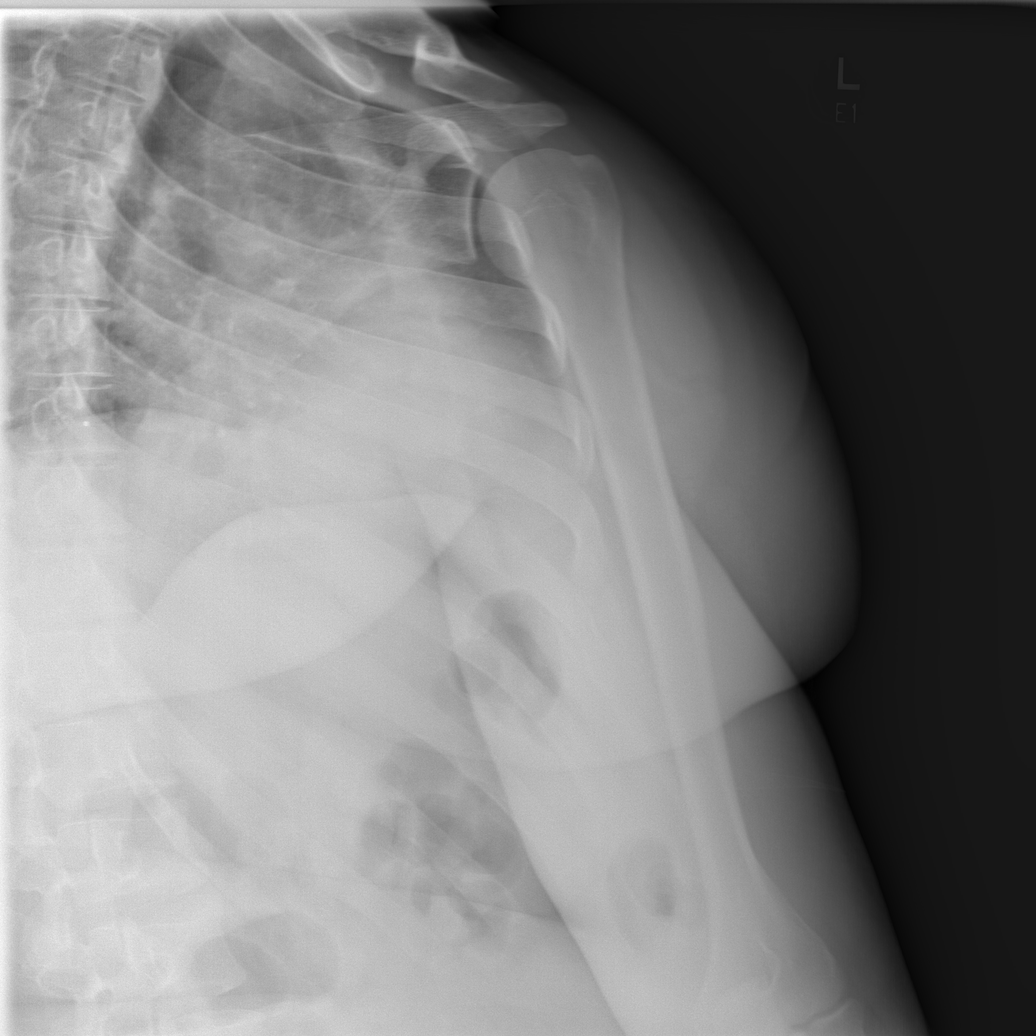

[w ribs obl left]
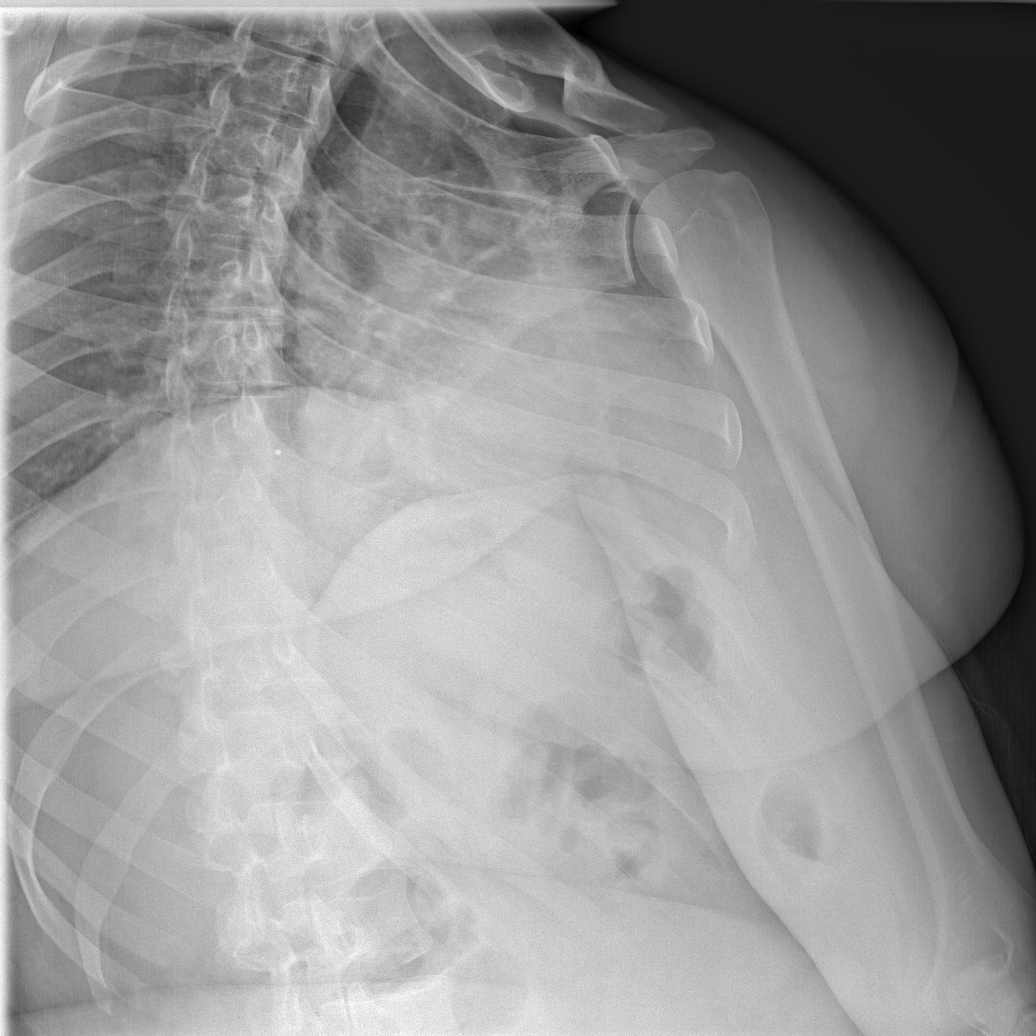

[w chest pa]
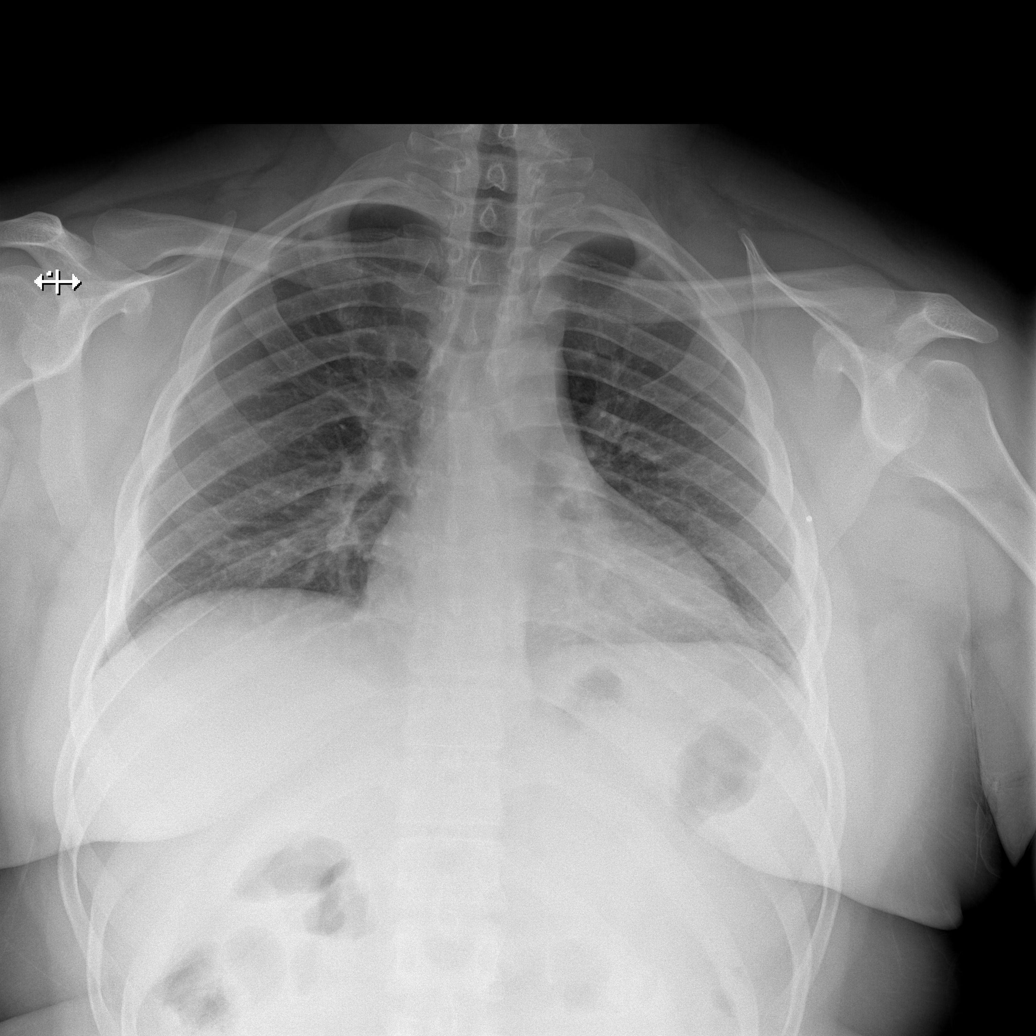

[4 of 4 positions shown; findings below may reference images not displayed]

FINDINGS: No fracture or other bone lesions are seen involving the ribs. There
is no evidence of pneumothorax or pleural effusion. Both lungs are
clear. Heart size and mediastinal contours are within normal limits.
IMPRESSION: Negative.

## 2022-07-29 IMAGING — CT CT CHEST W/ CM
2 of 3 series · 15 of 36 positions shown, 18 images · IV contrast (Omni 300)
Comparison: Rib radiographs 09/22/2021

CLINICAL DATA: Traumatic left scapular pain. Fell from 7 feet. Left
rib pain. Upper back pain. Suspect rib fractures.

EXAM:
CT CHEST WITH CONTRAST
TECHNIQUE: Multidetector CT imaging of the chest was performed during
intravenous contrast administration.
CONTRAST:  75mL OMNIPAQUE IOHEXOL 300 MG/ML  SOLN

[Series 1: chest with 2mm st · axial · 0.74mm/px · z∈[+1048,+1324]mm · 12 of 164 slices shown, 15 images]
[im 13/164  mediastinal]
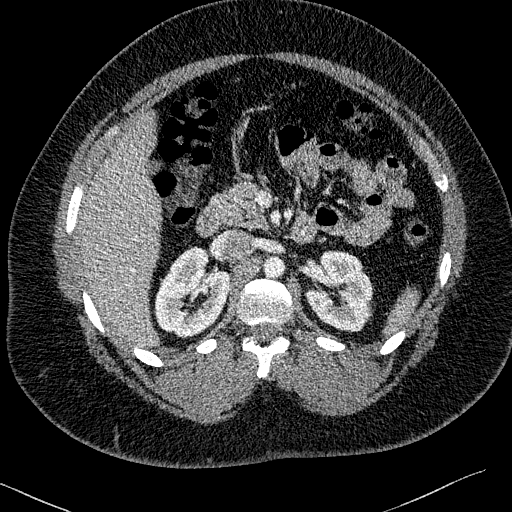
[im 13/164  lung]
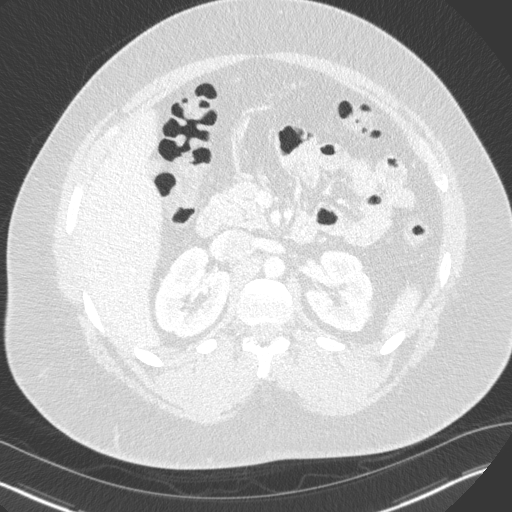
[im 25/164  lung]
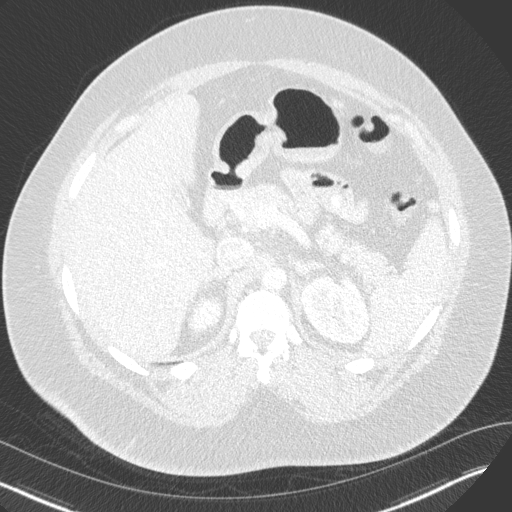
[im 37/164  lung]
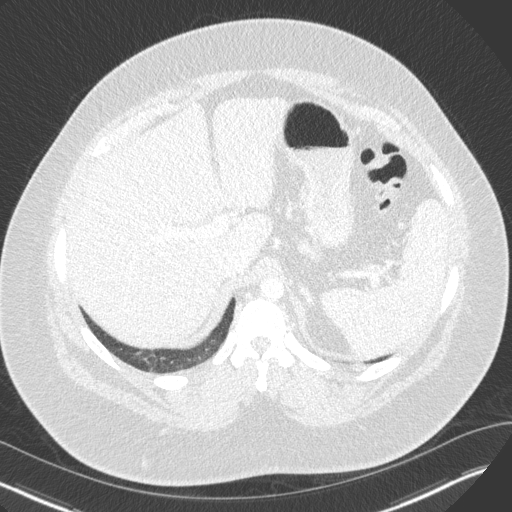
[im 49/164  lung]
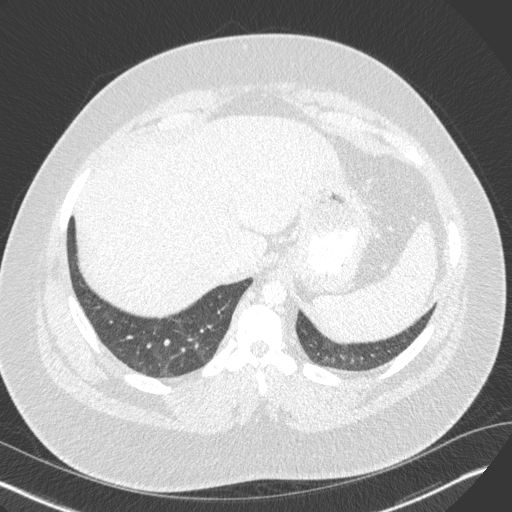
[im 61/164  mediastinal]
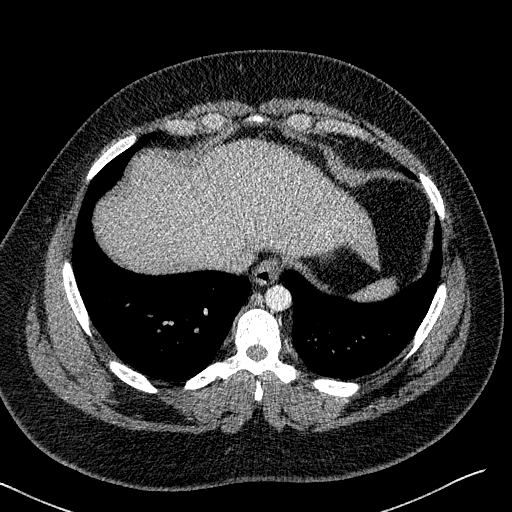
[im 61/164  lung]
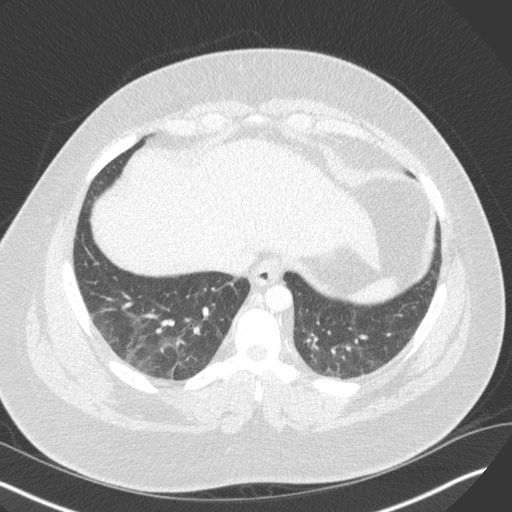
[im 73/164  lung]
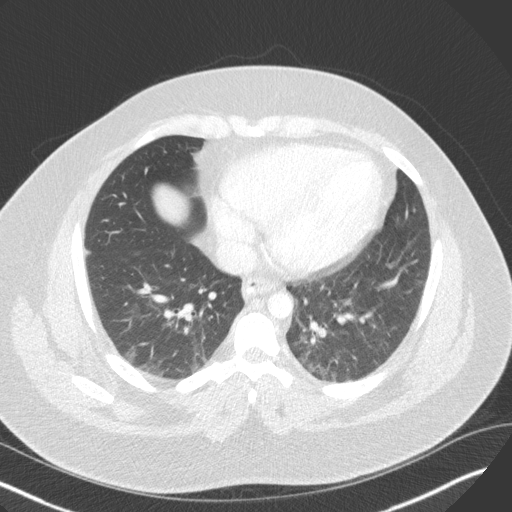
[im 91/164  lung]
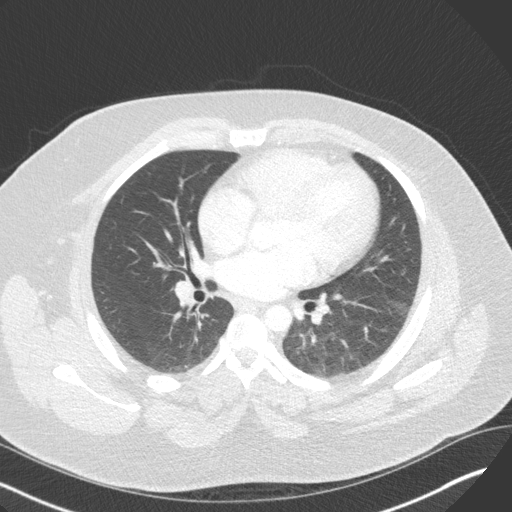
[im 103/164  lung]
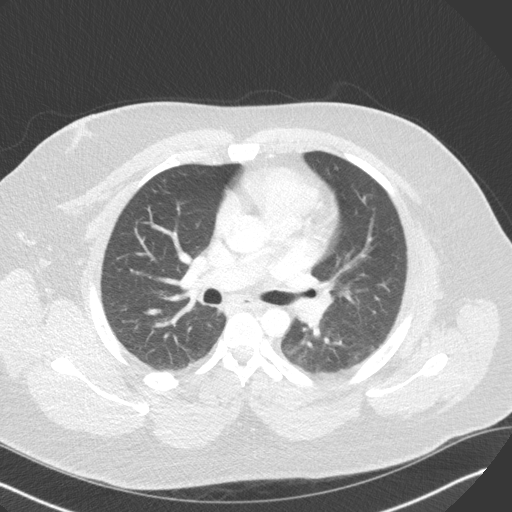
[im 115/164  mediastinal]
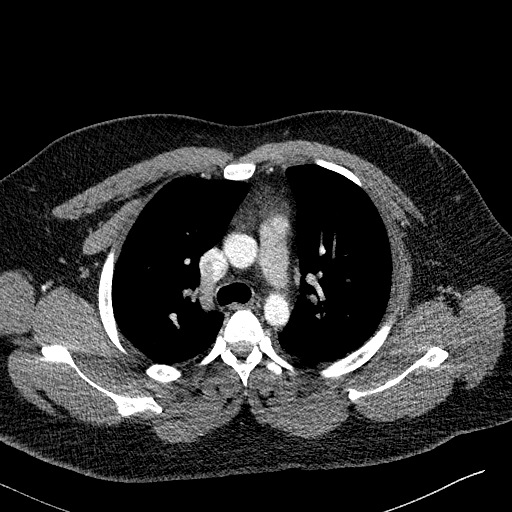
[im 115/164  lung]
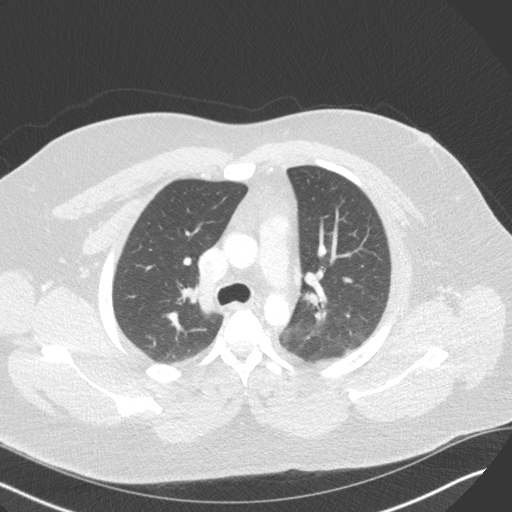
[im 127/164  lung]
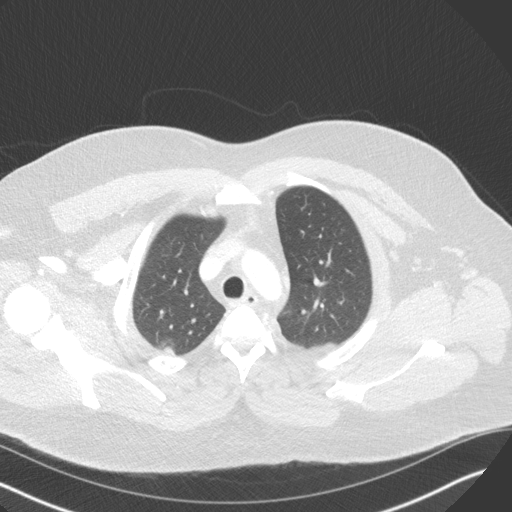
[im 139/164  lung]
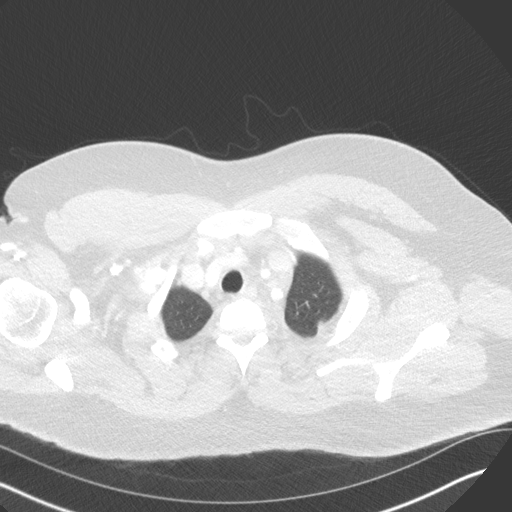
[im 151/164  lung]
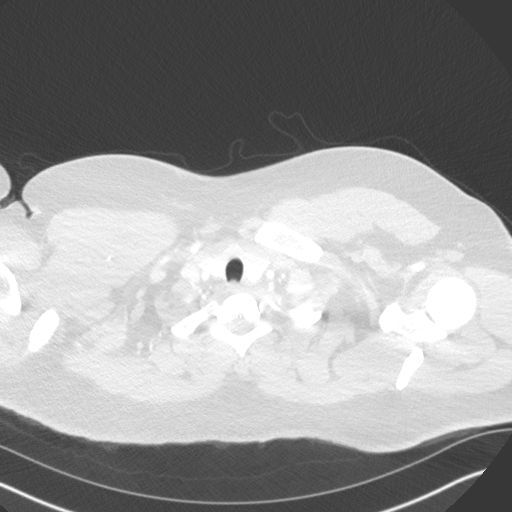

[Series 6: chest with 2mm st cor · coronal · 0.65mm/px · 3 of 147 slices shown]
[im 30/147  lung]
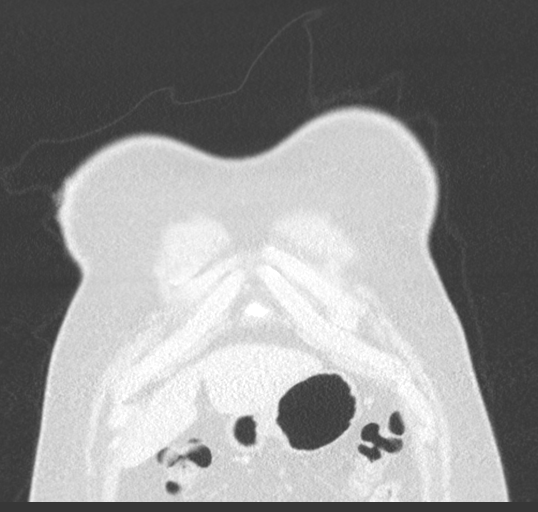
[im 59/147  lung]
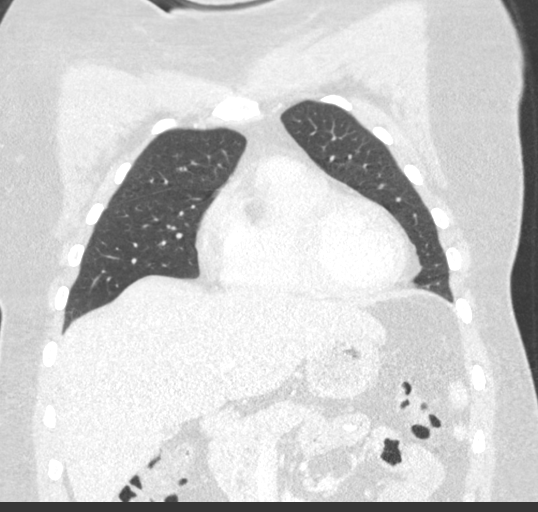
[im 88/147  lung]
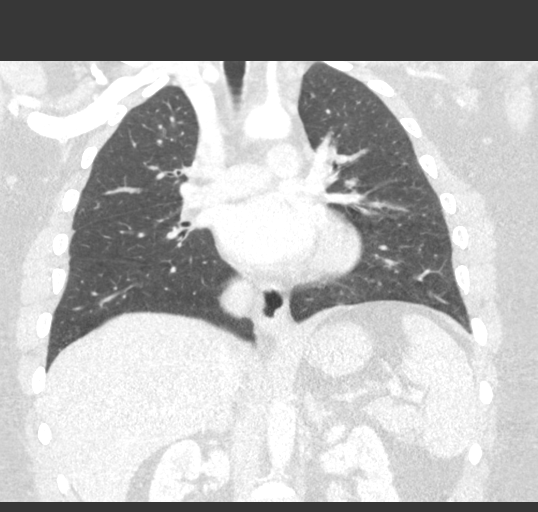

[15 of 36 positions shown; findings below may reference images not displayed]

FINDINGS: Cardiovascular: No significant vascular findings. Normal heart size.
No pericardial effusion.

Mediastinum/Nodes: No enlarged mediastinal, hilar, or axillary lymph
nodes. Thyroid gland, trachea, and esophagus demonstrate no
significant findings.

Lungs/Pleura: No pneumothorax.  Mild bilateral atelectasis.

Upper Abdomen: No acute abnormality.

Musculoskeletal: No displaced rib fractures. No displaced scapular
fracture.
IMPRESSION: 1. No acute abnormality of the chest.
2. No displaced rib or scapular fracture identified.

## 2022-07-29 IMAGING — CT CT T SPINE W/O CM
3 series · 16 of 33 positions shown, 19 images · IV contrast (Omni 300)
Comparison: None.

CLINICAL DATA: Fell 7 feet with left-sided chest pain and back
pain.

EXAM:
CT THORACIC SPINE WITHOUT CONTRAST
TECHNIQUE: Multidetector CT images of the thoracic were obtained using the
standard protocol without intravenous contrast.

[Series 3: tspine st · axial · 0.35mm/px · z∈[+1048,+1324]mm · 8 of 164 slices shown, 10 images]
[im 13/164  soft-tissue]
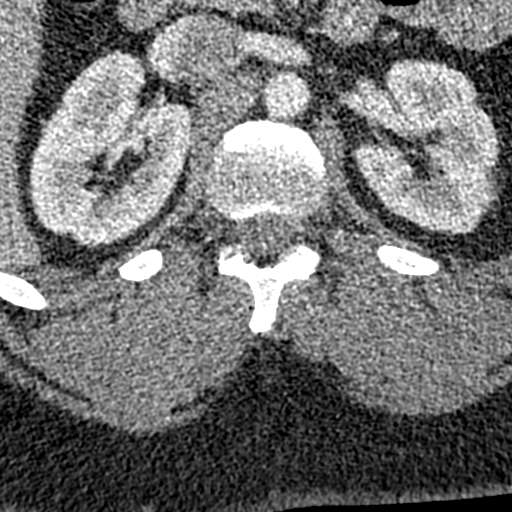
[im 13/164  bone]
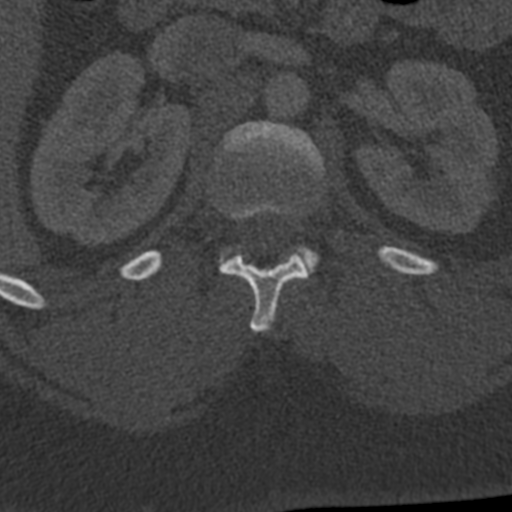
[im 38/164  bone]
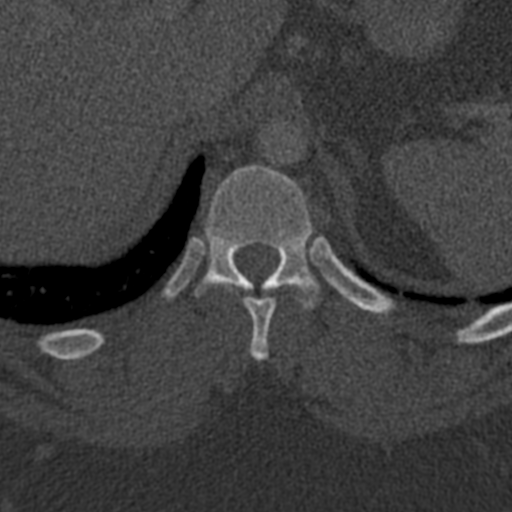
[im 51/164  bone]
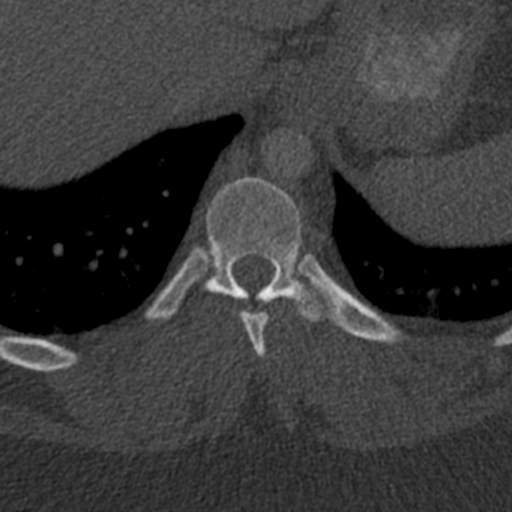
[im 76/164  bone]
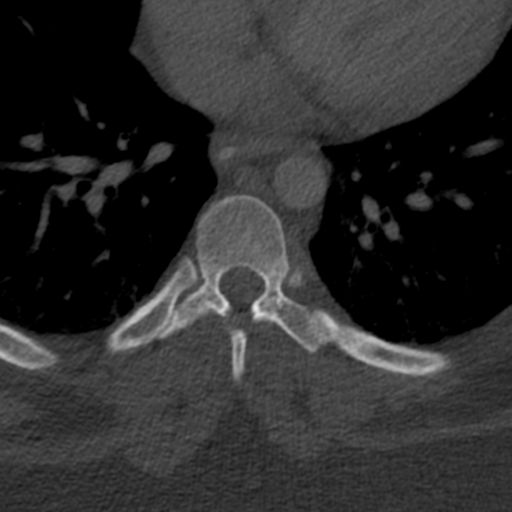
[im 88/164  soft-tissue]
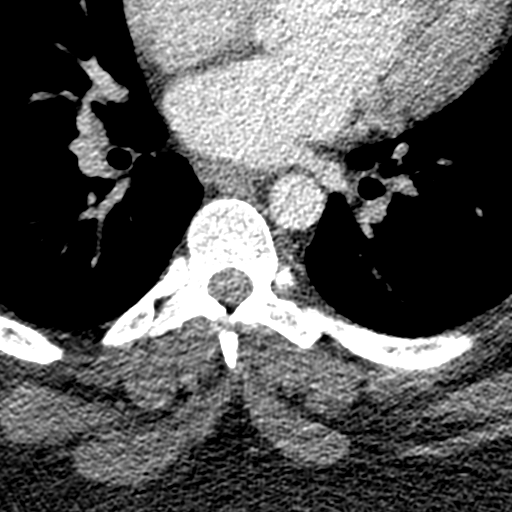
[im 88/164  bone]
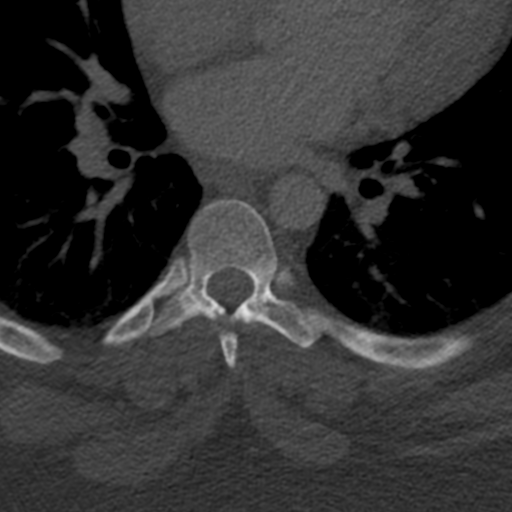
[im 113/164  bone]
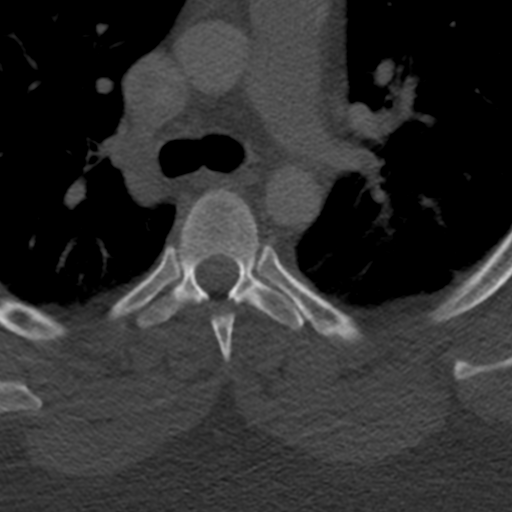
[im 126/164  bone]
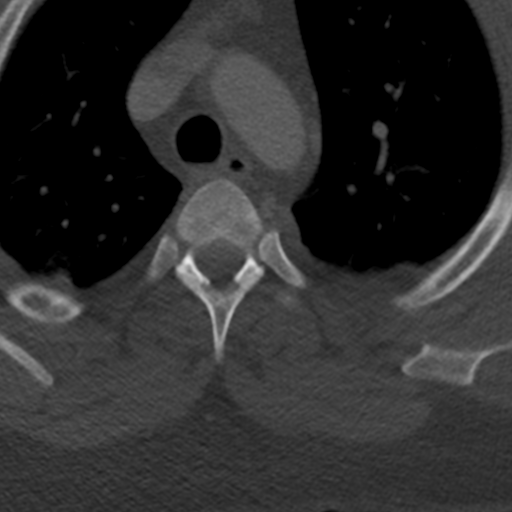
[im 151/164  bone]
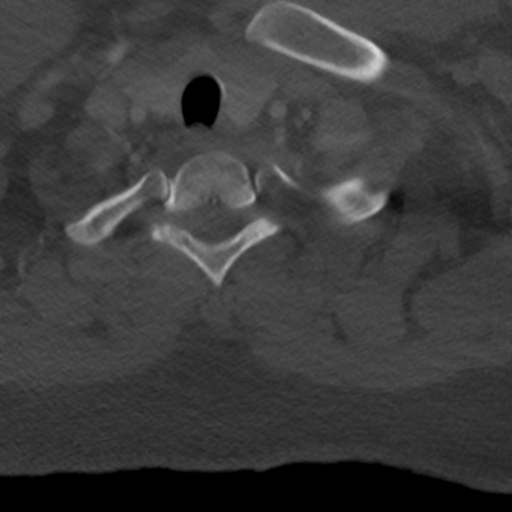

[Series 602: cor st · coronal · 0.64mm/px · 3 of 56 slices shown]
[im 12/56  bone]
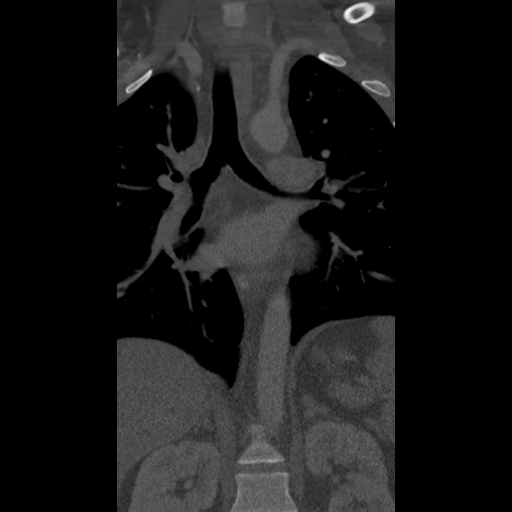
[im 23/56  bone]
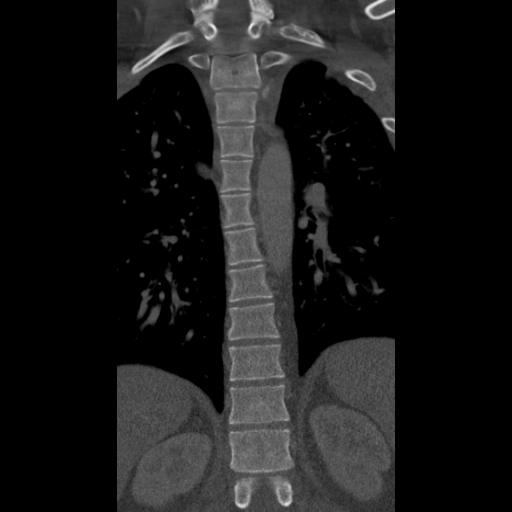
[im 34/56  bone]
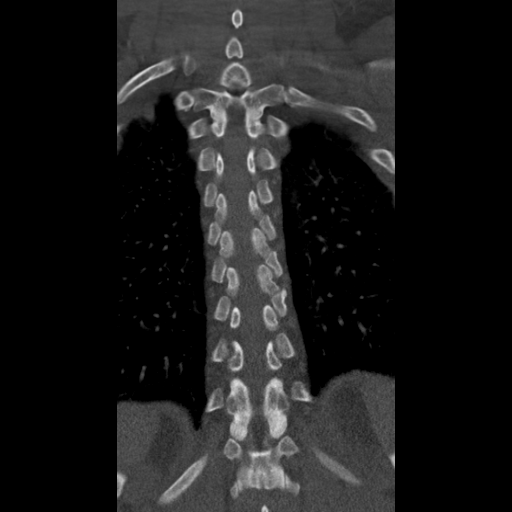

[Series 603: sag st · sagittal · 0.64mm/px · 5 of 53 slices shown, 6 images]
[im 18/53  bone]
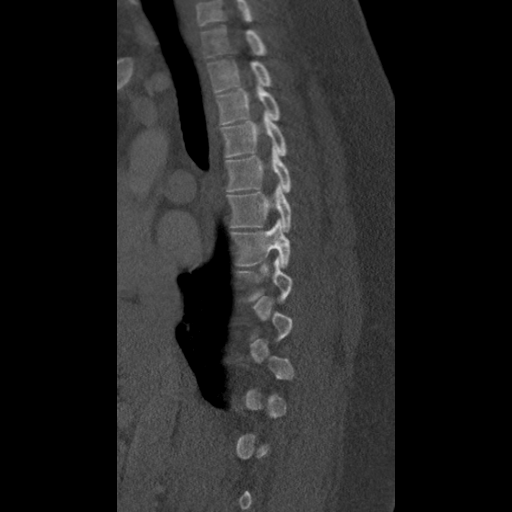
[im 22/53  bone]
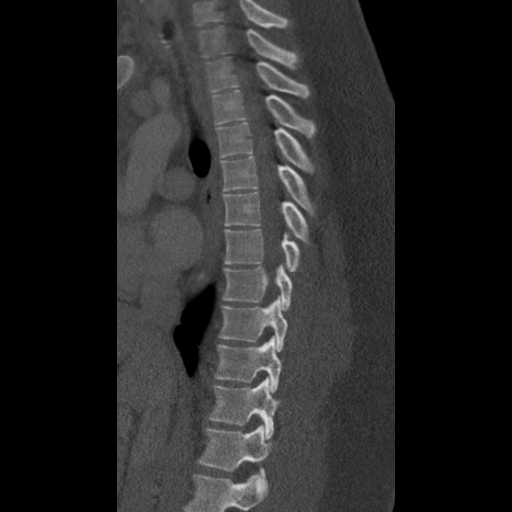
[im 27/53  soft-tissue]
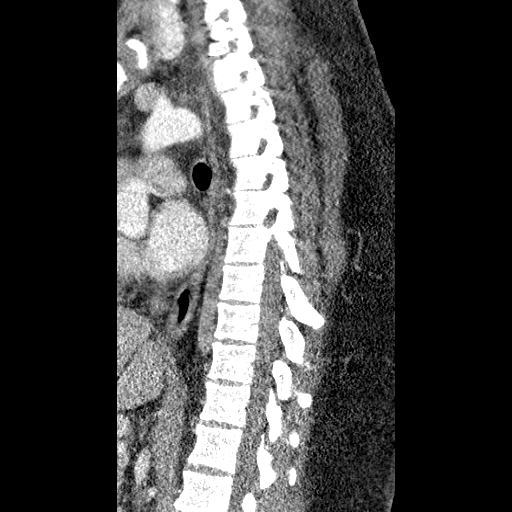
[im 27/53  bone]
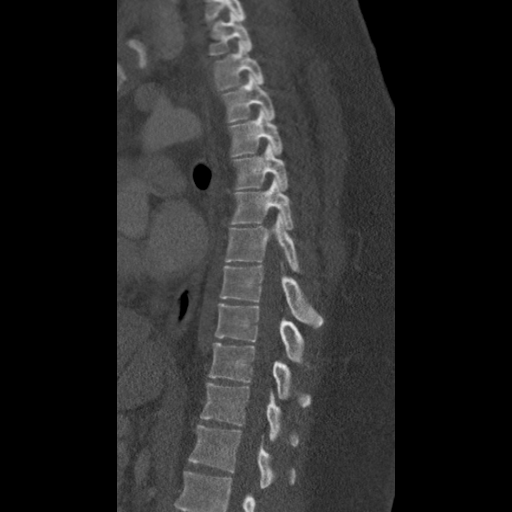
[im 31/53  bone]
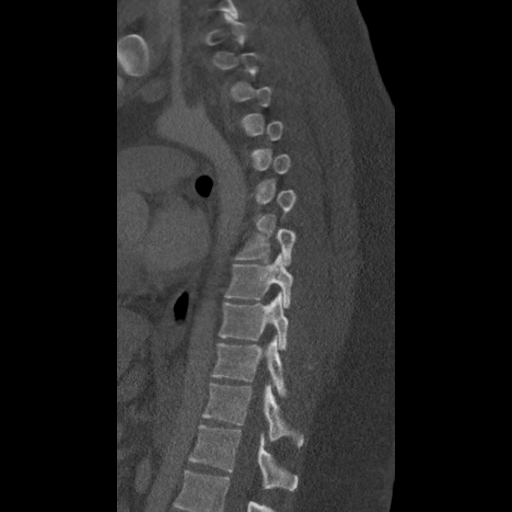
[im 35/53  bone]
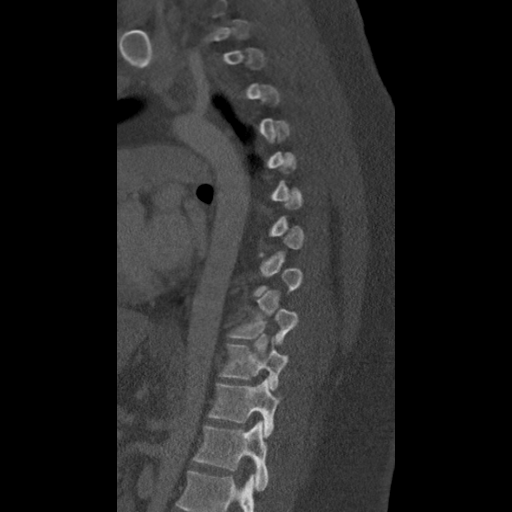

[16 of 33 positions shown; findings below may reference images not displayed]

FINDINGS: Alignment: Minimal scoliotic curvature.  No traumatic malalignment.

Vertebrae: No thoracic region fracture.

Paraspinal and other soft tissues: Negative

Disc levels: No thoracic region spinal degenerative changes.
Posteromedial ribs appear negative.
IMPRESSION: Mild scoliotic curvature of the thoracic spine. No acute or
traumatic finding.

## 2022-12-26 ENCOUNTER — Ambulatory Visit (HOSPITAL_BASED_OUTPATIENT_CLINIC_OR_DEPARTMENT_OTHER): Payer: Self-pay | Admitting: Family Medicine

## 2023-04-03 ENCOUNTER — Ambulatory Visit (HOSPITAL_BASED_OUTPATIENT_CLINIC_OR_DEPARTMENT_OTHER): Payer: Self-pay | Admitting: Family Medicine

## 2023-07-03 ENCOUNTER — Encounter: Payer: Self-pay | Admitting: Family

## 2023-07-03 ENCOUNTER — Encounter: Payer: Medicaid Other | Admitting: Family

## 2023-07-03 NOTE — Progress Notes (Signed)
  This encounter was created in error - please disregard. No show 

## 2024-01-30 ENCOUNTER — Other Ambulatory Visit (HOSPITAL_BASED_OUTPATIENT_CLINIC_OR_DEPARTMENT_OTHER): Payer: Self-pay

## 2024-01-30 ENCOUNTER — Other Ambulatory Visit: Payer: Self-pay

## 2024-01-30 ENCOUNTER — Emergency Department (HOSPITAL_BASED_OUTPATIENT_CLINIC_OR_DEPARTMENT_OTHER)
Admission: EM | Admit: 2024-01-30 | Discharge: 2024-01-30 | Disposition: A | Discharge status: Home / Self Care | Payer: Medicaid Other | Attending: Emergency Medicine | Admitting: Emergency Medicine

## 2024-01-30 ENCOUNTER — Emergency Department (HOSPITAL_BASED_OUTPATIENT_CLINIC_OR_DEPARTMENT_OTHER): Payer: Medicaid Other | Admitting: Radiology

## 2024-01-30 DIAGNOSIS — F17210 Nicotine dependence, cigarettes, uncomplicated: Secondary | ICD-10-CM | POA: Diagnosis not present

## 2024-01-30 DIAGNOSIS — R0789 Other chest pain: Secondary | ICD-10-CM | POA: Insufficient documentation

## 2024-01-30 LAB — BASIC METABOLIC PANEL
Anion gap: 6 (ref 5–15)
BUN: 14 mg/dL (ref 6–20)
CO2: 28 mmol/L (ref 22–32)
Calcium: 9 mg/dL (ref 8.9–10.3)
Chloride: 104 mmol/L (ref 98–111)
Creatinine, Ser: 1.18 mg/dL (ref 0.61–1.24)
GFR, Estimated: >60 mL/min (ref >60)
Glucose, Bld: 113 mg/dL — ABNORMAL HIGH (ref 70–99)
Potassium: 4.3 mmol/L (ref 3.5–5.1)
Sodium: 138 mmol/L (ref 135–145)

## 2024-01-30 LAB — CBC
HCT: 42.4 % (ref 39.0–52.0)
Hemoglobin: 13.7 g/dL (ref 13.0–17.0)
MCH: 27.8 pg (ref 26.0–34.0)
MCHC: 32.3 g/dL (ref 30.0–36.0)
MCV: 86 fL (ref 80.0–100.0)
Platelets: 240 10*3/uL (ref 150–400)
RBC: 4.93 MIL/uL (ref 4.22–5.81)
RDW: 13.7 % (ref 11.5–15.5)
WBC: 5 10*3/uL (ref 4.0–10.5)
nRBC: 0 % (ref 0.0–0.2)

## 2024-01-30 LAB — TROPONIN I (HIGH SENSITIVITY): Troponin I (High Sensitivity): 6 ng/L (ref <18)

## 2024-01-30 MED ORDER — KETOROLAC TROMETHAMINE 15 MG/ML IJ SOLN
15.0000 mg | Freq: Once | INTRAMUSCULAR | Status: AC
  Administered 2024-01-30: 15 mg via INTRAVENOUS
  Filled 2024-01-30: qty 1

## 2024-01-30 MED ORDER — MELOXICAM 15 MG PO TABS
15.0000 mg | ORAL_TABLET | Freq: Every day | ORAL | 0 refills | Status: DC | PRN
  Filled 2024-01-30: qty 30, 30d supply, fill #0

## 2024-01-30 NOTE — Discharge Instructions (Signed)
 As discussed, your workup today was overall reassuring.  Your heart enzymes were normal; low suspicion of a heart attack.  Your chest x-ray did not show evidence of collapsed lung, pneumonia or any other abnormality.  Suspect your symptoms are likely secondary to musculoskeletal etiology.  Will treat symptoms with NSAIDs in the outpatient setting and recommend follow-up with your primary care.  Attached is number to call to schedule an appointment with a primary care provider.  Please do not hesitate to return to emergency department if the worrisome signs and symptoms we discussed become apparent.

## 2024-01-30 NOTE — ED Triage Notes (Signed)
 Pt c/o LT side CP x 2 weeks, LT arm numbness yesterday that has resolved

## 2024-01-30 NOTE — ED Provider Notes (Signed)
 [place] EMERGENCY DEPARTMENT AT [facility] Provider Note   CSN: [id number] Arrival date & time: 01/30/24  4098     History  Chief Complaint  Patient presents with   Chest Pain    Richard Perez is a 33 y.o. male.   Chest Pain   33 year old male presents emergency department complaints of left-sided chest pain.  States that he has been having constant chest pain for the past 2 weeks.  States it is worse with movement of his left upper extremity and relieved with rest.  Has tried no medication specifically for his symptoms.  States that he does smoke cigarettes somewhat regularly and only feels short of breath whenever he is trying to walk while smoking; denies any change in his symptoms before hand.  Denies any fever, chills, cough, congestion, abdominal pain, nausea, vomiting.  States that he had left-sided chest pain with some radiation to his left shoulder and some tingling in his hand yesterday.  States that over the past month or so has been trying to work out more and is been at the gym doing more cardio exercises.  Denies any exertional worsening of symptoms.  Denies any change in symptoms with consumption of food/liquids.  Past medical history significant for substance abuse, GERD  Home Medications Prior to Admission medications   Medication Sig Start Date End Date Taking? Authorizing Provider  meloxicam (MOBIC) 15 MG tablet Take 1 tablet (15 mg total) by mouth daily as needed for pain. 01/30/24  Yes Sherian Maroon A, PA  acetaminophen (TYLENOL) 325 MG tablet Take 2 tablets (650 mg total) by mouth every 6 (six) hours as needed for up to 30 doses for moderate pain or mild pain. 06/09/22   Terald Sleeper, MD  albuterol (VENTOLIN HFA) 108 (90 Base) MCG/ACT inhaler Inhale 1-2 puffs into the lungs every 6 (six) hours as needed for wheezing or shortness of breath. 08/26/19   Rosezella Rumpf, PA-C  amoxicillin-clavulanate (AUGMENTIN) 875-125 MG tablet Take 1 tablet by  mouth every 12 (twelve) hours. 03/10/21   Elpidio Anis, PA-C  cyclobenzaprine (FLEXERIL) 10 MG tablet Take 1 tablet (10 mg total) by mouth 2 (two) times daily as needed for muscle spasms. 09/22/21   Fayrene Helper, PA-C  HYDROcodone-acetaminophen (NORCO/VICODIN) 5-325 MG tablet Take 1 tablet by mouth every 4 (four) hours as needed. 03/10/21   Elpidio Anis, PA-C      Allergies    Mold extract [trichophyton] and Other    Review of Systems   Review of Systems  Cardiovascular:  Positive for chest pain.  All other systems reviewed and are negative.   Physical Exam Updated Vital Signs BP 132/81 (BP Location: Right Arm)   Pulse 77   Temp 97.8 F (36.6 C) (Oral)   Resp 18   Wt (!) 145.2 kg   SpO2 100%   BMI 45.92 kg/m  Physical Exam Vitals and nursing note reviewed.  Constitutional:      General: He is not in acute distress.    Appearance: He is well-developed.  HENT:     Head: Normocephalic and atraumatic.  Eyes:     Conjunctiva/sclera: Conjunctivae normal.  Cardiovascular:     Rate and Rhythm: Normal rate and regular rhythm.     Pulses: Normal pulses.     Heart sounds: No murmur heard. Pulmonary:     Effort: Pulmonary effort is normal. No respiratory distress.     Breath sounds: Normal breath sounds. No wheezing, rhonchi or rales.  Chest:  Chest wall: Tenderness present.  Abdominal:     Palpations: Abdomen is soft.     Tenderness: There is no abdominal tenderness.  Musculoskeletal:        General: No swelling.     Cervical back: Neck supple.     Right lower leg: No edema.     Left lower leg: No edema.     Comments: Left-sided pectoralis tenderness.  Pain elicited with resisted horizontal adduction of left shoulder at 90 degree flexion.  Skin:    General: Skin is warm and dry.     Capillary Refill: Capillary refill takes less than 2 seconds.  Neurological:     Mental Status: He is alert.  Psychiatric:        Mood and Affect: Mood normal.     ED Results /  Procedures / Treatments   Labs (all labs ordered are listed, but only abnormal results are displayed) Labs Reviewed  BASIC METABOLIC PANEL - Abnormal; Notable for the following components:      Result Value   Glucose, Bld 113 (*)    All other components within normal limits  CBC  TROPONIN I (HIGH SENSITIVITY)    EKG None  Radiology DG Chest 2 View Result Date: 01/30/2024 CLINICAL DATA:  Chest pain. EXAM: CHEST - 2 VIEW COMPARISON:  12/23/2020. FINDINGS: The heart size and mediastinal contours are within normal limits. No focal consolidation, pleural effusion, or pneumothorax. No acute osseous abnormality. IMPRESSION: No acute cardiopulmonary findings. Electronically Signed   By: Hart Robinsons M.D.   On: 01/30/2024 10:31    Procedures Procedures    Medications Ordered in ED Medications  ketorolac (TORADOL) 15 MG/ML injection 15 mg (15 mg Intravenous Given 01/30/24 1610)    ED Course/ Medical Decision Making/ A&P                                 Medical Decision Making Amount and/or Complexity of Data Reviewed Labs: ordered. Radiology: ordered.  Risk Prescription drug management.   This patient presents to the ED for concern of chest pain, this involves an extensive number of treatment options, and is a complaint that carries with it a high risk of complications and morbidity.  The differential diagnosis includes ACS, PE, pneumothorax, musculoskeletal, GERD, aortic dissection, pericarditis/myocarditis/tamponade, other   Co morbidities that complicate the patient evaluation  See HPI   Additional history obtained:  Additional history obtained from EMR External records from outside source obtained and reviewed including hospital records   Lab Tests:  I Ordered, and personally interpreted labs.  The pertinent results include: No leukocytosis.  No evidence of anemia.  Platelets within range.  No Electra abnormalities.  No renal dysfunction.  Troponin of  6   Imaging Studies ordered:  I ordered imaging studies including chest x-ray I independently visualized and interpreted imaging which showed no acute cardiopulmonary abnormality. I agree with the radiologist interpretation   Cardiac Monitoring: / EKG:  The patient was maintained on a cardiac monitor.  I personally viewed and interpreted the cardiac monitored which showed an underlying rhythm of: Normal sinus rhythm.  Nonspecific ST/T wave changes.  Consultations Obtained:  N/a   Problem List / ED Course / Critical interventions / Medication management  Chest wall pain I ordered medication including toradol   Reevaluation of the patient after these medicines showed that the patient improved I have reviewed the patients home medicines and have made adjustments as  needed   Social Determinants of Health:  Polysubstance use.   Test / Admission - Considered:  Chest wall pain Vitals signs within normal range and stable throughout visit. Laboratory/imaging studies significant for: See above 33 year old male presents emergency department with complaints of chest pain.  Chest pain reported on the left side of the chest with some radiation to his left shoulder.  Symptoms present for the last 2 weeks and constant.  Worsened with movement of his left upper extremity.  No exertional worsening of symptoms.  On exam, tender to palpation of left-sided chest wall.  Workup today overall reassuring.  Troponin negative; given duration of the symptoms for the past 2 weeks, second troponin deemed unnecessary; low suspicion for ACS.  Patient without chest pain rating to back, pulse deficits, neurodeficits, hypertension; low suspicion for aortic dissection.  Patient without volume overload clinically without evidence of pulmonary vas congestion/pleural effusion on chest x-ray imaging; low suspicion for CHF.  Patient with Anner Crete PE score 0 and PERC negative; low suspicion for PE.  Chest x-ray without  evidence of pneumonia, pneumothorax or other acute cardiopulmonary malady.  Suspect more musculoskeletal cause of patient's left-sided chest pain.  Treated with NSAIDs and did note improvement while in the ED.  Recommend continued use of similar symptoms in the outpatient setting and follow-up with PCP.  Treatment plan discussed length with patient and he acknowledged understanding was agreeable to said plan.  Patient will well-appearing, afebrile in no acute distress. Worrisome signs and symptoms were discussed with the patient, and the patient acknowledged understanding to return to the ED if noticed. Patient was stable upon discharge.          Final Clinical Impression(s) / ED Diagnoses Final diagnoses:  Chest wall pain    Rx / DC Orders ED Discharge Orders          Ordered    meloxicam (MOBIC) 15 MG tablet  Daily PRN        01/30/24 1040              Peter Garter, [place] 01/30/24 1054    Margarita Grizzle, MD 02/10/24 [phone]

## 2024-01-30 NOTE — ED Notes (Signed)
 Discharge paperwork given and verbally understood.

## 2024-02-09 ENCOUNTER — Other Ambulatory Visit (HOSPITAL_BASED_OUTPATIENT_CLINIC_OR_DEPARTMENT_OTHER): Payer: Self-pay

## 2024-02-26 ENCOUNTER — Encounter (HOSPITAL_COMMUNITY): Payer: Self-pay

## 2024-02-26 ENCOUNTER — Ambulatory Visit (HOSPITAL_COMMUNITY)
Admission: EM | Admit: 2024-02-26 | Discharge: 2024-02-26 | Disposition: A | Discharge status: Home / Self Care | Attending: Family Medicine | Admitting: Family Medicine

## 2024-02-26 DIAGNOSIS — J069 Acute upper respiratory infection, unspecified: Secondary | ICD-10-CM | POA: Diagnosis not present

## 2024-02-26 DIAGNOSIS — J111 Influenza due to unidentified influenza virus with other respiratory manifestations: Secondary | ICD-10-CM

## 2024-02-26 LAB — POC COVID19/FLU A&B COMBO
Covid Antigen, POC: NEGATIVE
Influenza A Antigen, POC: NEGATIVE
Influenza B Antigen, POC: NEGATIVE

## 2024-02-26 MED ORDER — OSELTAMIVIR PHOSPHATE 75 MG PO CAPS
75.0000 mg | ORAL_CAPSULE | Freq: Two times a day (BID) | ORAL | 0 refills | Status: DC
Start: 2024-02-26 — End: 2024-05-12

## 2024-02-26 MED ORDER — BENZONATATE 200 MG PO CAPS: 200.0000 mg | ORAL_CAPSULE | Freq: Three times a day (TID) | ORAL | 0 refills | Status: DC | PRN

## 2024-02-26 NOTE — Discharge Instructions (Signed)
 You were seen today for upper respiratory symptoms.  Your flu and covid test was negative.  However, given your recent exposure and symptoms I have sent out tamiflu for possible flu.  I have also sent out a medication to help with cough.  Please get plenty of rest and fluids.  Use tylenol and motrin for pain and fevers.  Return if not improving.
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
## 2024-02-26 NOTE — ED Triage Notes (Signed)
 Cough,nasal congestion, chills started yesterday.

## 2024-05-12 ENCOUNTER — Other Ambulatory Visit: Payer: Self-pay

## 2024-05-12 ENCOUNTER — Encounter (HOSPITAL_BASED_OUTPATIENT_CLINIC_OR_DEPARTMENT_OTHER): Payer: Self-pay

## 2024-05-12 ENCOUNTER — Emergency Department (HOSPITAL_BASED_OUTPATIENT_CLINIC_OR_DEPARTMENT_OTHER)
Admission: EM | Admit: 2024-05-12 | Discharge: 2024-05-12 | Disposition: A | Discharge status: Home / Self Care | Attending: Emergency Medicine | Admitting: Emergency Medicine

## 2024-05-12 DIAGNOSIS — M545 Low back pain, unspecified: Secondary | ICD-10-CM | POA: Diagnosis present

## 2024-05-12 HISTORY — DX: Other intervertebral disc degeneration, lumbar region without mention of lumbar back pain or lower extremity pain: M51.369

## 2024-05-12 MED ORDER — METHOCARBAMOL 500 MG PO TABS
1000.0000 mg | ORAL_TABLET | Freq: Four times a day (QID) | ORAL | 0 refills | Status: AC
Start: 2024-05-12 — End: ?

## 2024-05-12 MED ORDER — PREDNISONE 20 MG PO TABS: ORAL_TABLET | ORAL | 0 refills | Status: AC

## 2024-05-12 MED ORDER — KETOROLAC TROMETHAMINE 15 MG/ML IJ SOLN
15.0000 mg | Freq: Once | INTRAMUSCULAR | Status: AC
  Administered 2024-05-12: 15 mg via INTRAMUSCULAR
  Filled 2024-05-12: qty 1

## 2024-05-12 NOTE — ED Provider Notes (Signed)
 [place] EMERGENCY DEPARTMENT AT [facility] Provider Note   CSN: [id number] Arrival date & time: 05/12/24  1311     History  Chief Complaint  Patient presents with   Back Pain    Richard Perez is a 33 y.o. male.  Patient who reports history of bulging disc in his back presents to the emergency department for worsening lower back pain over the past 2 weeks.  No new falls or injuries.  Patient describes a tightness in his lower back.  It moves towards the left side.  Sometimes he will have pain down into his left leg described as throbbing, but this is not currently present.  No lower extremity weakness, numbness or tingling.  Pain is worse with movement.  He is ambulatory but does have pain with movement.  He does not currently have a primary care doctor. Patient denies warning symptoms of back pain including: fecal incontinence, urinary retention or overflow incontinence, night sweats, waking from sleep with back pain, unexplained fevers or weight loss, h/o cancer, IVDU, recent trauma.          Home Medications Prior to Admission medications   Medication Sig Start Date End Date Taking? Authorizing Provider  benzonatate  (TESSALON ) 200 MG capsule Take 1 capsule (200 mg total) by mouth 3 (three) times daily as needed for cough. 02/26/24   Piontek, Cleveland Dales, MD  oseltamivir  (TAMIFLU ) 75 MG capsule Take 1 capsule (75 mg total) by mouth every 12 (twelve) hours. 02/26/24   Lesle Ras, MD      Allergies    Mold extract [trichophyton] and Other    Review of Systems   Review of Systems  Physical Exam Updated Vital Signs BP 125/73   Pulse 77   Temp 97.7 F (36.5 C)   Resp 18   Ht 5\' 10"  (1.778 m)   Wt (!) 142.9 kg   SpO2 96%   BMI 45.20 kg/m   Physical Exam Vitals and nursing note reviewed.  Constitutional:      Appearance: He is well-developed.  HENT:     Head: Normocephalic and atraumatic.     Nose: Nose normal.     Mouth/Throat:     Mouth: Mucous membranes  are moist.  Eyes:     Conjunctiva/sclera: Conjunctivae normal.  Abdominal:     Palpations: Abdomen is soft.     Tenderness: There is no abdominal tenderness. There is no right CVA tenderness or left CVA tenderness.  Musculoskeletal:        General: Normal range of motion.     Cervical back: Normal range of motion. No tenderness or bony tenderness.     Thoracic back: No tenderness or bony tenderness.     Lumbar back: Tenderness present. No bony tenderness.       Back:     Comments: No step-off noted with palpation of spine.   Skin:    General: Skin is warm and dry.  Neurological:     Mental Status: He is alert.     Sensory: No sensory deficit.     Motor: No abnormal muscle tone.     Comments: 5/5 strength in entire lower extremities bilaterally. No sensation deficit.   Psychiatric:        Mood and Affect: Mood normal.     ED Results / Procedures / Treatments   Labs (all labs ordered are listed, but only abnormal results are displayed) Labs Reviewed - No data to display  EKG None  Radiology No results found.  Procedures Procedures    Medications Ordered in ED Medications  ketorolac  (TORADOL ) 15 MG/ML injection 15 mg (15 mg Intramuscular Given 05/12/24 1501)    ED Course/ Medical Decision Making/ A&P    Patient seen and examined. History obtained directly from patient.    Labs/EKG: None ordered.  Imaging: None ordered. Considering imaging of the lumbar spine with x-ray or CT but no red flags today and feel this would be low yield.   Medications/Fluids: Ordered: IM Toradol   Most recent vital signs reviewed and are as follows: BP 125/73   Pulse 77   Temp 97.7 F (36.5 C)   Resp 18   Ht 5\' 10"  (1.778 m)   Wt (!) 142.9 kg   SpO2 96%   BMI 45.20 kg/m   Initial impression: Low back pain, acute on chronic/recurrent  Home treatment plan: Patient was counseled on back pain precautions and advised to do activity as tolerated but avoid strenuous activity and do  not lift, push, or pull heavy objects more than 10 pounds for the next week.  Patient counseled to use ice or heat on back as needed for pain and spasm.    Medications prescribed:  Robaxin for muscle pain and spasm. Patient counseled on proper use of muscle relaxant medication.  They were requested not to drink alcohol, drive any vehicle, or do any dangerous activities while taking this medication due to potential drowsiness and unintended.  We will also prescribe taper course of prednisone .  Patient verbalized understanding.  Return instructions discussed with patient: Urged to return with worsening severe pain, loss of bowel or bladder control, trouble walking, development of weakness in the legs, or with any other concerns.  Follow-up instructions discussed with patient: Patient urged to follow-up with PCP if pain does not improve with treatment and rest or if pain becomes recurrent.                                   Medical Decision Making Risk Prescription drug management.   Patient with back pain. No neurological deficits. Patient is ambulatory. No warning symptoms of back pain including: fecal incontinence, urinary retention or overflow incontinence, night sweats, waking from sleep with back pain, unexplained fevers or weight loss, h/o cancer, IVDU, recent trauma. No concern for cauda equina, epidural abscess, or other serious cause of back pain. Conservative measures such as rest, ice/heat and pain medicine indicated with PCP follow-up if no improvement with conservative management.           Final Clinical Impression(s) / ED Diagnoses Final diagnoses:  Acute left-sided low back pain without sciatica    Rx / DC Orders ED Discharge Orders          Ordered    methocarbamol (ROBAXIN) 500 MG tablet  4 times daily        05/12/24 1459    predniSONE  (DELTASONE ) 20 MG tablet        05/12/24 1459              Lyna Sandhoff, PA-C 05/12/24 1510    Curatolo, Adam,  DO 05/13/24 [phone]

## 2024-05-12 NOTE — ED Triage Notes (Signed)
 Lower back pain. PMH bulging disc. Reports pain got worse 1 month ago and worsening.

## 2024-05-12 NOTE — Discharge Instructions (Addendum)
 Please read and follow all provided instructions.  Your diagnoses today include:  1. Acute left-sided low back pain without sciatica    Tests performed today include: Vital signs - see below for your results today  Medications prescribed:  Robaxin (methocarbamol) - muscle relaxer medication  DO NOT drive or perform any activities that require you to be awake and alert because this medicine can make you drowsy.   Prednisone  - steroid medicine   It is best to take this medication in the morning to prevent sleeping problems. If you are diabetic, monitor your blood sugar closely and stop taking Prednisone  if blood sugar is over 300. Take with food to prevent stomach upset.   Please use over-the-counter NSAID medications (ibuprofen , naproxen) or Tylenol  (acetaminophen ) as directed on the packaging for pain -- as long as you do not have any reasons avoid these medications. Reasons to avoid NSAID medications include: weak kidneys, a history of bleeding in your stomach or gut, or uncontrolled high blood pressure or previous heart attack. Reasons to avoid Tylenol  include: liver problems or ongoing alcohol use. Never take more than 4000mg  or 8 Extra strength Tylenol  in a 24 hour period.     Take any prescribed medications only as directed.  Home care instructions:  Follow any educational materials contained in this packet Please rest, use ice or heat on your back for the next several days Do not lift, push, pull anything more than 10 pounds for the next week  Follow-up instructions: Please follow-up with your primary care provider in the next 1 week for further evaluation of your symptoms.   Return instructions:  SEEK IMMEDIATE MEDICAL ATTENTION IF YOU HAVE: New numbness, tingling, weakness, or problem with the use of your arms or legs Severe back pain not relieved with medications Loss control of your bowels or bladder Increasing pain in any areas of the body (such as chest or abdominal  pain) Shortness of breath, dizziness, or fainting.  Worsening nausea (feeling sick to your stomach), vomiting, fever, or sweats Any other emergent concerns regarding your health   Additional Information:  Your vital signs today were: BP 125/73   Pulse 77   Temp 97.7 F (36.5 C)   Resp 18   Ht 5\' 10"  (1.778 m)   Wt (!) 142.9 kg   SpO2 96%   BMI 45.20 kg/m  If your blood pressure (BP) was elevated above 135/85 this visit, please have this repeated by your doctor within one month. --------------

## 2024-07-22 ENCOUNTER — Other Ambulatory Visit: Payer: Self-pay

## 2024-07-22 ENCOUNTER — Ambulatory Visit: Admission: EM | Admit: 2024-07-22 | Discharge: 2024-07-22 | Disposition: A | Discharge status: Home / Self Care

## 2024-07-22 DIAGNOSIS — R0789 Other chest pain: Secondary | ICD-10-CM | POA: Diagnosis not present

## 2024-07-22 MED ORDER — TIZANIDINE HCL 4 MG PO CAPS: 4.0000 mg | ORAL_CAPSULE | Freq: Three times a day (TID) | ORAL | 0 refills | Status: AC | PRN

## 2024-07-22 MED ORDER — IBUPROFEN 800 MG PO TABS: 800.0000 mg | ORAL_TABLET | Freq: Three times a day (TID) | ORAL | 0 refills | Status: AC | PRN

## 2024-07-22 NOTE — ED Triage Notes (Signed)
 Pt states he is having chest pain on his left side that radiates to his back. Pt states it is a heavy, pressure feeling that started last night, with SOB on exertion.
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
## 2024-07-22 NOTE — Discharge Instructions (Signed)
 Your workup today so far has been reassuring, however as discussed we are unable to officially rule out more emergent causes of your pain in the setting and that would need to be done in the emergency department.  We will try anti-inflammatory pain medication and muscle relaxers, heat, massage, gentle stretches but if your symptoms progress or are not improving follow-up in the emergency department.

## 2024-12-23 ENCOUNTER — Ambulatory Visit
Admission: EM | Admit: 2024-12-23 | Discharge: 2024-12-23 | Disposition: A | Discharge status: Home / Self Care | Attending: Family Medicine | Admitting: Family Medicine

## 2024-12-23 DIAGNOSIS — J0300 Acute streptococcal tonsillitis, unspecified: Secondary | ICD-10-CM | POA: Diagnosis not present

## 2024-12-23 LAB — POCT RAPID STREP A (OFFICE): Rapid Strep A Screen: POSITIVE — AB

## 2024-12-23 LAB — POC COVID19/FLU A&B COMBO
Covid Antigen, POC: NEGATIVE
Influenza A Antigen, POC: NEGATIVE
Influenza B Antigen, POC: NEGATIVE

## 2024-12-23 MED ORDER — ACETAMINOPHEN 325 MG PO TABS
650.0000 mg | ORAL_TABLET | Freq: Once | ORAL | Status: AC
  Administered 2024-12-23: 650 mg via ORAL

## 2024-12-23 MED ORDER — LIDOCAINE VISCOUS HCL 2 % MT SOLN: 10.0000 mL | OROMUCOSAL | 0 refills | Status: DC | PRN

## 2024-12-23 MED ORDER — AMOXICILLIN 875 MG PO TABS: 875.0000 mg | ORAL_TABLET | Freq: Two times a day (BID) | ORAL | 0 refills | Status: DC
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
## 2024-12-23 NOTE — Discharge Instructions (Signed)
 Take the full course of antibiotics even once you are feeling better.  Change out your toothbrush after about 2 days on the antibiotics to ensure you do not reinfect yourself.  Make sure to stay well-hydrated.  Ibuprofen  and Tylenol  as needed for pain and fever.  You are considered no longer contagious after 24 hours on the medication.

## 2024-12-23 NOTE — ED Triage Notes (Signed)
 Pt reports sore throat, ear pain and cough started 3 days.

## 2024-12-24 ENCOUNTER — Telehealth: Payer: Self-pay | Admitting: Emergency Medicine

## 2024-12-24 MED ORDER — LIDOCAINE VISCOUS HCL 2 % MT SOLN: 10.0000 mL | OROMUCOSAL | 0 refills | Status: AC | PRN

## 2024-12-24 MED ORDER — AMOXICILLIN 875 MG PO TABS: 875.0000 mg | ORAL_TABLET | Freq: Two times a day (BID) | ORAL | 0 refills | Status: AC

## 2024-12-24 NOTE — Telephone Encounter (Signed)
 Unable to get in touch with pharmacy at time of resend. Contacted pt and he reported has already picked up px. Was able to contact pharmacy and re-send cancelled with pharmacy.

## 2024-12-24 NOTE — Telephone Encounter (Signed)
 Pt called and reported pharmacy did not receive prescriptions from 1/19 visit. Prescriptions resent electronically.
